# Patient Record
Sex: Female | Born: 1960
Health system: Southern US, Community
[De-identification: ages and names within clinical notes are randomized; demographics above are authoritative.]

## PROBLEM LIST (undated history)

## (undated) DIAGNOSIS — Z9889 Other specified postprocedural states: Secondary | ICD-10-CM

## (undated) DIAGNOSIS — J45909 Unspecified asthma, uncomplicated: Secondary | ICD-10-CM

## (undated) DIAGNOSIS — T8859XA Other complications of anesthesia, initial encounter: Secondary | ICD-10-CM

## (undated) DIAGNOSIS — D649 Anemia, unspecified: Secondary | ICD-10-CM

## (undated) DIAGNOSIS — C73 Malignant neoplasm of thyroid gland: Secondary | ICD-10-CM

## (undated) DIAGNOSIS — M1990 Unspecified osteoarthritis, unspecified site: Secondary | ICD-10-CM

## (undated) HISTORY — PX: TONSILLECTOMY: SUR1361

## (undated) HISTORY — PX: BUNIONECTOMY: SHX129

## (undated) HISTORY — PX: THYROIDECTOMY: SHX17

## (undated) HISTORY — PX: APPENDECTOMY: SHX54

---
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
## 1994-01-27 HISTORY — PX: BUNIONECTOMY: SHX129

## 2000-01-28 HISTORY — PX: THYROIDECTOMY: SHX17

## 2007-02-25 ENCOUNTER — Emergency Department (HOSPITAL_COMMUNITY): Admission: EM | Admit: 2007-02-25 | Discharge: 2007-02-25 | Payer: Self-pay | Admitting: Family Medicine

## 2008-02-18 ENCOUNTER — Ambulatory Visit: Payer: Self-pay | Admitting: Family Medicine

## 2008-02-18 DIAGNOSIS — J45909 Unspecified asthma, uncomplicated: Secondary | ICD-10-CM

## 2008-03-07 ENCOUNTER — Ambulatory Visit: Payer: Self-pay | Admitting: Occupational Medicine

## 2008-03-10 ENCOUNTER — Ambulatory Visit: Payer: Self-pay | Admitting: Family Medicine

## 2008-03-12 ENCOUNTER — Encounter (INDEPENDENT_AMBULATORY_CARE_PROVIDER_SITE_OTHER): Payer: Self-pay | Admitting: Occupational Medicine

## 2009-05-09 ENCOUNTER — Encounter (HOSPITAL_COMMUNITY): Admission: RE | Admit: 2009-05-09 | Discharge: 2009-08-07 | Payer: Self-pay | Admitting: Internal Medicine

## 2009-11-22 ENCOUNTER — Ambulatory Visit (HOSPITAL_BASED_OUTPATIENT_CLINIC_OR_DEPARTMENT_OTHER): Admission: RE | Admit: 2009-11-22 | Discharge: 2009-11-22 | Payer: Self-pay | Admitting: Internal Medicine

## 2009-11-22 ENCOUNTER — Ambulatory Visit: Payer: Self-pay | Admitting: Internal Medicine

## 2009-11-22 ENCOUNTER — Telehealth: Payer: Self-pay | Admitting: Internal Medicine

## 2009-11-22 ENCOUNTER — Ambulatory Visit: Payer: Self-pay | Admitting: Diagnostic Radiology

## 2009-11-22 DIAGNOSIS — M25579 Pain in unspecified ankle and joints of unspecified foot: Secondary | ICD-10-CM | POA: Insufficient documentation

## 2009-11-22 LAB — CONVERTED CEMR LAB
Ferritin: 2 ng/mL — ABNORMAL LOW (ref 10–291)
Iron: 18 ug/dL — ABNORMAL LOW (ref 42–145)
TIBC: 442 ug/dL (ref 250–470)
UIBC: 424 ug/dL

## 2009-11-23 ENCOUNTER — Telehealth: Payer: Self-pay | Admitting: Internal Medicine

## 2009-11-26 ENCOUNTER — Encounter: Payer: Self-pay | Admitting: Internal Medicine

## 2009-11-29 ENCOUNTER — Telehealth: Payer: Self-pay | Admitting: Internal Medicine

## 2009-12-05 ENCOUNTER — Other Ambulatory Visit: Admission: RE | Admit: 2009-12-05 | Discharge: 2009-12-05 | Payer: Self-pay | Admitting: Internal Medicine

## 2009-12-05 ENCOUNTER — Ambulatory Visit: Payer: Self-pay | Admitting: Family

## 2009-12-05 DIAGNOSIS — Z85850 Personal history of malignant neoplasm of thyroid: Secondary | ICD-10-CM

## 2009-12-07 ENCOUNTER — Encounter: Payer: Self-pay | Admitting: Family

## 2009-12-10 ENCOUNTER — Encounter: Payer: Self-pay | Admitting: Internal Medicine

## 2009-12-11 ENCOUNTER — Ambulatory Visit: Payer: Self-pay | Admitting: Internal Medicine

## 2009-12-11 LAB — CONVERTED CEMR LAB: Fecal Occult Bld: NEGATIVE

## 2010-01-03 ENCOUNTER — Encounter: Payer: Self-pay | Admitting: Internal Medicine

## 2010-01-03 LAB — CONVERTED CEMR LAB
Basophils Absolute: 0 10*3/uL (ref 0.0–0.1)
Eosinophils Relative: 4 % (ref 0–5)
HCT: 41.1 % (ref 36.0–46.0)
Hemoglobin: 13 g/dL (ref 12.0–15.0)
Lymphocytes Relative: 18 % (ref 12–46)
Lymphs Abs: 1.2 10*3/uL (ref 0.7–4.0)
Monocytes Absolute: 0.3 10*3/uL (ref 0.1–1.0)
Monocytes Relative: 5 % (ref 3–12)
RBC: 4.85 M/uL (ref 3.87–5.11)
RDW: 22.9 % — ABNORMAL HIGH (ref 11.5–15.5)

## 2010-01-18 ENCOUNTER — Ambulatory Visit: Payer: Self-pay | Admitting: Internal Medicine

## 2010-02-26 NOTE — Letter (Signed)
   [facility] [address] [place], [place]  [address]  [place] Phone: [phone]      December 07, 2009   [facility] [address] [place], [place] [address]  RE:  LAB RESULTS  Dear  Ms. [facility],  The following is an interpretation of your most recent lab tests.  Please take note of any instructions provided or changes to medications that have resulted from your lab work.  Pap Smear: normal - follow up in 1 year   Sincerely Yours,    Lemont Fillers FNP  Appended Document:  mailed

## 2010-02-26 NOTE — Progress Notes (Signed)
Summary: faxed Dr Allena Katz for Medical Records   Phone Note Outgoing Call   Call placed by: Marj Call placed to: Dr Smiley Houseman Summary of Call: faxed Dr Allena Katz for medical records  Initial call taken by: Roselle Locus,  November 22, 2009 9:13 AM

## 2010-02-26 NOTE — Progress Notes (Signed)
Summary: Lab results  Phone Note Outgoing Call   Summary of Call: call pt - iron levels low. I suggest pt take iron supplement (slow Fe ) once daily.  do not take with thyroid medication Initial call taken by: D. Thomos Lemons DO,  November 23, 2009 3:02 PM  Follow-up for Phone Call        RBC folate will need to be redrawn per Katrina at [place] [place]. Specimen quantity not sufficient to complete test.  Needs to call pt to redraw specimen and send new order to lab. Nicki Guadalajara Fergerson CMA Duncan Dull)  November 23, 2009 4:43 PM   Additional Follow-up for Phone Call Additional follow up Details #1::        [facility] for Pt to CB Additional Follow-up by: Payton Spark CMA,  November 23, 2009 3:32 PM    Additional Follow-up for Phone Call Additional follow up Details #2::    call placed to patient at [phone], she has been advised per Dr Artist Pais instructions.  She states she will be in today for blood draw. Follow-up by: [name] CMA,  November 26, 2009 8:20 AM

## 2010-02-26 NOTE — Assessment & Plan Note (Signed)
Summary: New est care/dt UMR--Rm 3   Vital Signs:  Patient profile:   50 year old female Menstrual status:  regular LMP:     11/22/2009 Height:      65.5 inches Weight:      202.50 pounds BMI:     33.31 Temp:     98.1 degrees F oral Pulse rate:   66 / minute Pulse rhythm:   regular Resp:     16 per minute BP sitting:   138 / 90  (left arm) Cuff size:   regular  Vitals Entered By: Mervin Kung CMA Duncan Dull) (November 22, 2009 9:41 AM) CC: New pt to establish care. Recently had low hemoglobin at her health assessment. Is Patient Diabetic? No Pain Assessment Patient in pain? no      LMP (date): 11/22/2009     Menstrual Status regular Enter LMP: 11/22/2009 Last PAP Result normal   Primary Care Provider:  Dondra Spry DO  CC:  New pt to establish care. Recently had low hemoglobin at her health assessment..  History of Present Illness:  50 y/o female to establish she denies chronic illness she recently found to have anemia as recent health screening she denies unusually heavy menstruations   Current Diet: Breakfast: carnation instand breakfast,  gram cracker Lunch:  sandwich,  broccholi ,  cheese DInner:  mcdonald or burger king,  [place] bacon Snacks:  chocolate, cookies Beverage:  water, diet dr. 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 Allena Katz) Hx of chicken pox GERD Allergies  Past Surgical History: Thyroidectomy--2005 Appendectomy--1968 Tonsillectomy / Addenoidectomy--1967 Bunionectomy w/pin Rt. 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   Head:  normocephalic and atraumatic.   Eyes:  pupils equal, pupils round, and pupils reactive to light.   Ears:  R ear normal and L ear normal.   Mouth:  pharynx pink and moist.   Neck:  No deformities, masses, or tenderness noted.no carotid bruits.   Lungs:  normal respiratory effort, normal breath sounds, no crackles, and no wheezes.   Heart:  normal rate, regular rhythm, no murmur, and no gallop.   Abdomen:  soft, non-tender, normal bowel sounds, no masses, no hepatomegaly, and no splenomegaly.   Extremities:  No lower extremity edema  Neurologic:  cranial nerves II-XII intact and gait normal.   Psych:  normally interactive, good eye contact, not anxious appearing, and not depressed appearing.     Impression & Recommendations:  Problem # 1:  ANEMIA (ICD-285.9) pt found to have mild anemia at recent health screen. 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 1 tab by mouth once daily monday through saturday and 1 1/2 tablets on sunday. 2)  Zantac  150 Mg Tabs (Ranitidine hcl) .... Take 1 tab by mouth at bedtime. 3)  Loratadine 10 Mg Tabs (Loratadine) .... Take 1 tablet daily as needed. 4)  Proair Hfa 108 (90 Base) Mcg/act Aers (Albuterol sulfate) .[name].. 1 - 2 puffs as needed. 5)  Sudafed Pe Maximum Strength 10 Mg Tabs (Phenylephrine hcl) .... As needed. 6)  Ibuprofen 200 Mg Tabs (Ibuprofen) .... As needed. 7)  Aleve 220 Mg Tabs (Naproxen sodium) .... As needed  Patient Instructions: 1)  Please schedule a follow-up appointment in 2 months. 2)  CBC:  285.9 3)  Please return for lab work one (1) week before your next appointment.  4)  Schedule separate appointment for PAP / Pelvic with Sandford Craze   Orders Added: 1)  T-Folic Acid; RBC [82747-23360] 2)  T-Iron [41660-63016] 3)  T-Iron Binding Capacity (TIBC) [[address]] 4)  T-Ferritin [82728-23350] 5)  T-Vitamin B12 [82607-23330] 6)  Mammogram (Screening) [Mammo] 7)  New Patient Level III [73220]     Preventive Care Screening  Last Flu Shot:    Date:  11/07/2009    Results:  historical   Bone Density:    Date:  01/28/2007    Results:  normal std dev  Last Tetanus Booster:    Date:  09/28/2002    Results:  Historical   Mammogram:    Date:  01/27/2002    Results:  normal   Pap Smear:    Date:  01/27/2002    Results:  normal      Pt  had abnormal pap smear in past and biopsy. Atypical cells. Recent pap smear normal. Mervin Kung CMA Duncan Dull)  November 22, 2009 9:56 AM    Current Allergies (reviewed today): No known allergies

## 2010-02-26 NOTE — Assessment & Plan Note (Signed)
Summary: pap/mhf-- Rm 5   Vital Signs:  Patient profile:   50 year old female Menstrual status:  regular LMP:     11/23/2009 Height:      65.5 inches Weight:      201.50 pounds BMI:     33.14 Temp:     97.9 degrees F oral Pulse rate:   72 / minute Pulse rhythm:   regular Resp:     16 per minute BP sitting:   116 / 76  (right arm) Cuff size:   large  Vitals Entered By: Mervin Kung CMA Duncan Dull) (December 05, 2009 8:56 AM) CC: Pt here for pap smear. Is Patient Diabetic? No Pain Assessment Patient in pain? no      Comments Pt states she has started Slo Fe 1 once daily. Nicki Guadalajara Fergerson CMA Duncan Dull)  December 05, 2009 9:01 AM  LMP (date): 11/23/2009     Enter LMP: 11/23/2009 Last PAP Result normal   Primary Care Elza Sortor:  Dondra Spry DO  CC:  Pt here for pap smear.[name]  History of Present Illness: Ms Wingard is a 50 year old female who presents today for a routine Pap smear.  She recently completed her mammogram in the end of October and it was normal.  She reports + history of abnormal pap smear in 1992 whichwas followed by a  laser cone biopsy.  Patient's last Pap smear was in 1994.  Patient is [id number].  LMP 10 days ago- has one day heavy 5 days, denies cramping.  She is being treated for iron deficiency anemia.  Allergies (verified): No Known Drug Allergies  Past History:  Past Medical History: Last updated: 11/22/2009 Asthma Thyroid Cancer--2005 (Stage IV - followed by Dr. Allena Katz) Hx of chicken pox GERD Allergies  Past Surgical History: Last updated: 11/22/2009 Thyroidectomy--2005 Appendectomy--1968 Tonsillectomy / Addenoidectomy--1967 Bunionectomy w/pin Rt. great toe--1998 Laser Cone Biopsy of cervix--1992  Review of Systems       see HPI  Physical Exam  General:  Well-developed,well-nourished,in no acute distress; alert,appropriate and cooperative throughout examination Head:  Normocephalic and atraumatic without obvious abnormalities. No  apparent alopecia or balding. Breasts:  No mass, nodules, thickening, tenderness, bulging, retraction, inflamation, nipple discharge or skin changes noted.   Lungs:  Normal respiratory effort, chest expands symmetrically. Lungs are clear to auscultation, no crackles or wheezes. Heart:  Normal rate and regular rhythm. S1 and S2 normal without gallop, murmur, click, rub or other extra sounds. Genitalia:  Pelvic Exam:        External: normal female genitalia without lesions or masses        Vagina: normal without lesions or masses        Cervix: normal without lesions or masses        Adnexa: normal bimanual exam without masses or fullness        Uterus: normal by palpation        Pap smear: performed   Impression & Recommendations:  Problem # 1:  ROUTINE GYNECOLOGICAL EXAMINATION (ICD-V72.31) Assessment Comment Only Pap was performed today.  Given history of abnormal pap, will plan f/u pap in 1 year.   Complete Medication List: 1)  Synthroid 137 Mcg Tabs (Levothyroxine sodium) .[name].. 1 tab by mouth once daily monday through saturday and 1 1/2 tablets on sunday. 2)  Zantac 150 Mg Tabs (Ranitidine hcl) .... Take 1 tab by mouth at bedtime. 3)  Loratadine 10 Mg Tabs (Loratadine) .... Take 1 tablet daily as needed. 4)  Proair  Hfa 108 (90 Base) Mcg/act Aers (Albuterol sulfate) .[name].. 1 - 2 puffs as needed. 5)  Sudafed Pe Maximum Strength 10 Mg Tabs (Phenylephrine hcl) .... As needed. 6)  Ibuprofen 200 Mg Tabs (Ibuprofen) .... As needed. 7)  Aleve 220 Mg Tabs (Naproxen sodium) .... As needed 8)  Slow Fe 160 (50 Fe) Mg Cr-tabs (Ferrous sulfate dried) .... Take 1 tablet by mouth once a day.  Patient Instructions: 1)  We will contact you with the results of your Pap smear.    Orders Added: 1)  Est. Patient Level III [47829]    Current Allergies (reviewed today): No known allergies

## 2010-02-26 NOTE — Letter (Signed)
Summary: [facility]: Immunoassay Fecal Occult Blood (iFOB) Order Psychologist, counselling at [facility]  [address]   [place], [place] [address]   Phone: [phone]  Fax: [phone]      [facility]: Immunoassay Fecal Occult Blood (iFOB) Order Form   November 22, 2009 MRN: [id number]   April Stone 01/30/1960   Physicican Name:Dr Thomos Lemons  Diagnosis Code: V70.0      [name] CMA

## 2010-02-26 NOTE — Progress Notes (Signed)
Summary: records rec from Dr Allena Katz   Phone Note Other Incoming   Caller: Dr Allena Katz  Summary of Call: records rec from Dr Allena Katz  Initial call taken by: Roselle Locus,  November 29, 2009 1:07 PM

## 2010-02-26 NOTE — Letter (Signed)
     December 11, 2009   [facility] [address] [place], [place] [address]  RE:  LAB RESULTS  Dear  Ms. [facility],  The following is an interpretation of your most recent lab tests.  Please take note of any instructions provided or changes to medications that have resulted from your lab work.  Hemoccult Test for blood in stool:  negative       Sincerely Yours,    Dr. Thomos Lemons  Appended Document:  mailed

## 2010-02-26 NOTE — Miscellaneous (Signed)
Summary: lab order  Clinical Lists Changes  Orders: Added new Test order of T-CBC w/Diff [phone]) - Signed

## 2010-02-26 NOTE — Letter (Signed)
Summary: [facility]: Immunoassay Fecal Occult Blood (iFOB) Order Psychologist, counselling at [facility]  [address]   [place], [place] [address]   Phone: [phone]  Fax: [phone]      [facility]: Immunoassay Fecal Occult Blood (iFOB) Order Form   December 10, 2009 MRN: [id number]   April Stone [birth year]   Physicican Name: Thomos Lemons  Diagnosis Code: V70.0      [name] CMA

## 2010-02-28 NOTE — Assessment & Plan Note (Signed)
Summary: 2 month follow up/mhf   Vital Signs:  Patient profile:   50 year old female Menstrual status:  regular Height:      65.5 inches Weight:      199.75 pounds BMI:     32.85 O2 Sat:      98 % on Room air Temp:     98.1 degrees F oral Pulse rate:   71 / minute Resp:     22 per minute BP sitting:   110 / 80  (right arm) Cuff size:   large  Vitals Entered By: [name] CMA (January 18, 2010 9:12 AM)  O2 Flow:  Room air CC: 2 Month Follow up , asthma Is Patient Diabetic? No Pain Assessment Patient in pain? no      Comments no concerns, rx refill on proair, feel much better   Primary Care Provider:  D. Thomos Lemons DO  CC:  2 Month Follow up  and asthma.  History of Present Illness:       This is a 50 year old female who presents with asthma.  The patient denies cough, shortness of breath, chest pain, mucous production, and nocturnal awakening.  Previous effective treatment includes short acting beta-agonist: and inhaled corticosteroids.  rarely uses proair  Preventive Screening-Counseling & Management  Alcohol-Tobacco     Smoking Status: never  Allergies (verified): No Known Drug Allergies  Past History:  Past Medical History: Asthma Thyroid Cancer--2005 (Stage IV - followed by Dr. Allena Katz) Hx of chicken pox  GERD Allergies  Past Surgical History: Thyroidectomy--2005 Appendectomy--1968 Tonsillectomy / Addenoidectomy--1967 Bunionectomy w/pin Rt. 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  Physical Exam  General:  alert, well-developed, and well-nourished.   Lungs:  Normal respiratory effort, chest expands  symmetrically. Lungs are clear to auscultation, no crackles or wheezes. Heart:  Normal rate and regular rhythm. S1 and S2 normal without gallop, murmur, click, rub or other extra sounds.   Impression & Recommendations:  Problem # 1:  ANEMIA (ICD-285.9) Assessment New Hg normalized with oral iron replacement still mentruating heavily 1 day per month FOBT was negative we discussed routine screening colonoscopy next yr continue oral iron until menopause  Her updated medication list for this problem includes:    Slow Fe 160 (50 Fe) Mg Cr-tabs (Ferrous sulfate dried) .[name]... Take 1 tablet by mouth once a day.  Problem # 2:  ASTHMA (ICD-493.90) Assessment: Unchanged denies chronic cough only uses proair on rare occ. also before exercise and before exposure to cold air UTD with flu vaccine refilled rescue inhaler  Her updated medication list for this problem includes:    Proair Hfa 108 (90 Base) Mcg/act Aers (Albuterol sulfate) .[name]... 1 - 2 puffs as needed.  Problem # 3:  THYROID CANCER, HX OF (ICD-V10.87) Assessment: Unchanged pt to see endo in March for routine surveillance  Complete Medication List: 1)  Synthroid 137 Mcg Tabs (Levothyroxine sodium) .[name].. 1 tab by mouth once daily monday through saturday and 1 1/2 tablets on sunday. 2)  Zantac 150 Mg Tabs (Ranitidine hcl) .... Take 1 tab by mouth at bedtime. 3)  Loratadine 10 Mg Tabs (Loratadine) .... Take 1 tablet daily as needed. 4)  Proair Hfa 108 (90 Base) Mcg/act Aers (Albuterol sulfate) .[name].. 1 - 2 puffs as needed. 5)  Sudafed Pe Maximum Strength 10 Mg Tabs (Phenylephrine hcl) .... As needed. 6)  Ibuprofen 200 Mg Tabs (Ibuprofen) .... As needed. 7)  Aleve 220 Mg Tabs (Naproxen sodium) .... As needed 8)  Slow Fe 160 (50 Fe) Mg Cr-tabs (Ferrous sulfate dried) .... Take 1 tablet by mouth once a day.  Patient Instructions: 1)  Please schedule a follow-up appointment in 1 year. Prescriptions: PROAIR HFA 108 (90 BASE) MCG/ACT AERS  (ALBUTEROL SULFATE) 1 - 2 puffs as needed.  #1 x 5   Entered and Authorized by:   D. Thomos Lemons DO   Signed by:   D. Thomos Lemons DO on 01/18/2010   Method used:   Electronically to        [facility] [address] (retail)       [address]       [facility]       [place], [place]  [address]       Ph: [phone]       Fax: [phone]   RxID:   [id number]    Orders Added: 1)  Est. Patient Level III [84132]    Current Allergies (reviewed today): No known allergies

## 2010-11-21 ENCOUNTER — Encounter: Payer: Self-pay | Admitting: Internal Medicine

## 2010-11-21 DIAGNOSIS — Z0289 Encounter for other administrative examinations: Secondary | ICD-10-CM

## 2011-12-21 ENCOUNTER — Emergency Department (INDEPENDENT_AMBULATORY_CARE_PROVIDER_SITE_OTHER): Payer: 59

## 2011-12-21 ENCOUNTER — Emergency Department
Admission: EM | Admit: 2011-12-21 | Discharge: 2011-12-21 | Disposition: A | Discharge status: Home / Self Care | Payer: 59 | Source: Home / Self Care

## 2011-12-21 DIAGNOSIS — J40 Bronchitis, not specified as acute or chronic: Secondary | ICD-10-CM

## 2011-12-21 DIAGNOSIS — J069 Acute upper respiratory infection, unspecified: Secondary | ICD-10-CM

## 2011-12-21 DIAGNOSIS — R05 Cough: Secondary | ICD-10-CM

## 2011-12-21 DIAGNOSIS — J45901 Unspecified asthma with (acute) exacerbation: Secondary | ICD-10-CM

## 2011-12-21 DIAGNOSIS — D649 Anemia, unspecified: Secondary | ICD-10-CM | POA: Insufficient documentation

## 2011-12-21 DIAGNOSIS — R062 Wheezing: Secondary | ICD-10-CM

## 2011-12-21 HISTORY — DX: Anemia, unspecified: D64.9

## 2011-12-21 HISTORY — DX: Malignant neoplasm of thyroid gland: C73

## 2011-12-21 MED ORDER — ALBUTEROL SULFATE HFA 108 (90 BASE) MCG/ACT IN AERS: 2.0000 | INHALATION_SPRAY | RESPIRATORY_TRACT | Status: DC | PRN

## 2011-12-21 MED ORDER — HYDROCOD POLST-CHLORPHEN POLST 10-8 MG/5ML PO LQCR: 5.0000 mL | Freq: Two times a day (BID) | ORAL | Status: DC | PRN

## 2011-12-21 MED ORDER — METHYLPREDNISOLONE SODIUM SUCC 125 MG IJ SOLR
125.0000 mg | Freq: Once | INTRAMUSCULAR | Status: AC
  Administered 2011-12-21: 125 mg via INTRAMUSCULAR

## 2011-12-21 MED ORDER — PREDNISONE 50 MG PO TABS
ORAL_TABLET | ORAL | Status: DC
Start: 2011-12-21 — End: 2012-11-16

## 2011-12-21 MED ORDER — AZITHROMYCIN 250 MG PO TABS: ORAL_TABLET | ORAL | Status: DC

## 2011-12-21 NOTE — ED Notes (Signed)
April Stone complains of productive cough with yellow sputum, congestion, wheezing and sneezing for 3 weeks. She has taken mucinex with little relief. Denies fever, chills or sweats.

## 2011-12-21 NOTE — ED Provider Notes (Signed)
History     CSN: [id number]  Arrival date & time 12/21/11  1131   First MD Initiated Contact with Patient 12/21/11 1139      Chief Complaint  Patient presents with  . Cough    x 3 weeks  . Nasal Congestion    HPI  URI Symptoms Onset: 3 weeks  Description: nasal congestion, drainage, cough, wheezing, SOB.  Modifying factors:  Baseline hx/o asthma. Has been using inhaler up to 8x per day.   Symptoms Nasal discharge: yes Fever: no Sore throat: no Cough: yes Wheezing: yes Ear pain: no GI symptoms: no Sick contacts: yes   Red Flags  Stiff neck: no Dyspnea: mild Rash: no Swallowing difficulty: no  Sinusitis Risk Factors Headache/face pain: no Double sickening: no tooth pain: no  Allergy Risk Factors Sneezing: no Itchy scratchy throat: no Seasonal symptoms: no  Flu Risk Factors Headache: no muscle aches: no severe fatigue: no   Past Medical History  Diagnosis Date  . Thyroid disease   . Thyroid cancer   . Anemia     Past Surgical History  Procedure Date  . Appendectomy   . Thyroidectomy   . Bunionectomy     Family History  Problem Relation Age of Onset  . Hypertension Mother   . Hypertension Father     History  Substance Use Topics  . Smoking status: Never Smoker   . Smokeless tobacco: Never Used  . Alcohol Use: No    OB History    Grav Para Term Preterm Abortions TAB SAB Ect Mult Living                  Review of Systems  All other systems reviewed and are negative.    Allergies  Review of patient's allergies indicates no known allergies.  Home Medications   Current Outpatient Rx  Name  Route  Sig  Dispense  Refill  . ALBUTEROL SULFATE HFA 108 (90 BASE) MCG/ACT IN AERS   Inhalation   Inhale 2 puffs into the lungs every 6 (six) hours as needed.         [name] FERROUS SULFATE CR 160 (50 FE) MG PO TBCR   Oral   Take 160 mg by mouth daily.         [name] LEVOTHYROXINE SODIUM 137 MCG PO TABS   Oral   Take 137 mcg by mouth  daily.         [name] RANITIDINE HCL 150 MG PO TABS   Oral   Take 150 mg by mouth 2 (two) times daily.           BP 147/88  Pulse 76  Temp 98.1 F (36.7 C) (Oral)  Resp 18  Ht 5\' 6"  (1.676 m)  Wt 193 lb (87.544 kg)  BMI 31.15 kg/m2  SpO2 98%  Physical Exam  Constitutional: She appears well-developed and well-nourished.  HENT:  Head: Normocephalic and atraumatic.  Right Ear: External ear normal.  Left Ear: External ear normal.       +nasal erythema, rhinorrhea bilaterally, + post oropharyngeal erythema    Eyes: Conjunctivae normal are normal. Pupils are equal, round, and reactive to light.  Neck: Normal range of motion. Neck supple.  Cardiovascular: Normal rate, regular rhythm and normal heart sounds.   Pulmonary/Chest: Effort normal. She has wheezes.       + faint rales in LLL  Abdominal: Soft.  Musculoskeletal: Normal range of motion.  Lymphadenopathy:    She has no cervical adenopathy.  Neurological: She is alert.  Skin: Skin is warm.    ED Course  Procedures (including critical care time)  Labs Reviewed - No data to display No results found.   1. URI (upper respiratory infection)   2. Bronchitis   3. Asthma exacerbation       MDM  Viral induced asthma exacerbation.  CXR negative for infiltrate.  Solumedrol 125mg  IM x1 Prednisone x 7 days.  Zpak for atypical coverage.  Albuterol refilled.  Discussed infectious and resp red flags.  Follow up with PCP in 7-10 days if sxs not improved.     The patient and/or caregiver has been counseled thoroughly with regard to treatment plan and/or medications prescribed including dosage, schedule, interactions, rationale for use, and possible side effects and they verbalize understanding. Diagnoses and expected course of recovery discussed and will return if not improved as expected or if the condition worsens. Patient and/or caregiver verbalized understanding.             Doree Albee, MD 12/21/11  [phone]

## 2012-07-26 ENCOUNTER — Encounter (HOSPITAL_BASED_OUTPATIENT_CLINIC_OR_DEPARTMENT_OTHER): Payer: Self-pay | Admitting: *Deleted

## 2012-07-26 ENCOUNTER — Emergency Department (HOSPITAL_BASED_OUTPATIENT_CLINIC_OR_DEPARTMENT_OTHER)
Admission: EM | Admit: 2012-07-26 | Discharge: 2012-07-27 | Disposition: A | Discharge status: Home / Self Care | Payer: 59 | Attending: Emergency Medicine | Admitting: Emergency Medicine

## 2012-07-26 DIAGNOSIS — Z7982 Long term (current) use of aspirin: Secondary | ICD-10-CM | POA: Insufficient documentation

## 2012-07-26 DIAGNOSIS — Z862 Personal history of diseases of the blood and blood-forming organs and certain disorders involving the immune mechanism: Secondary | ICD-10-CM | POA: Insufficient documentation

## 2012-07-26 DIAGNOSIS — Z85850 Personal history of malignant neoplasm of thyroid: Secondary | ICD-10-CM | POA: Insufficient documentation

## 2012-07-26 DIAGNOSIS — IMO0002 Reserved for concepts with insufficient information to code with codable children: Secondary | ICD-10-CM | POA: Insufficient documentation

## 2012-07-26 DIAGNOSIS — Y9389 Activity, other specified: Secondary | ICD-10-CM | POA: Insufficient documentation

## 2012-07-26 DIAGNOSIS — M2391 Unspecified internal derangement of right knee: Secondary | ICD-10-CM

## 2012-07-26 DIAGNOSIS — E079 Disorder of thyroid, unspecified: Secondary | ICD-10-CM | POA: Insufficient documentation

## 2012-07-26 DIAGNOSIS — X500XXA Overexertion from strenuous movement or load, initial encounter: Secondary | ICD-10-CM | POA: Insufficient documentation

## 2012-07-26 DIAGNOSIS — Y929 Unspecified place or not applicable: Secondary | ICD-10-CM | POA: Insufficient documentation

## 2012-07-26 DIAGNOSIS — S8990XA Unspecified injury of unspecified lower leg, initial encounter: Secondary | ICD-10-CM | POA: Insufficient documentation

## 2012-07-26 NOTE — ED Provider Notes (Signed)
History  This chart was scribed for April Booze, MD by Ardelia Mems, ED Scribe. This patient was seen in room MH05/MH05 and the patient's care was started at 11:54 PM.  CSN: [id number]  Arrival date & time 07/26/12  2343    Chief Complaint  Patient presents with  . Knee Pain    The history is provided by the patient. No language interpreter was used.   HPI Comments: April Stone is a 52 y.o. female with a hx of anemia and thyroid cancer who presents to the Emergency Department complaining of a sudden onset of constant, waxing an waning, mild-to-severe right knee pain onset 1 hour ago. Pt states that when the pain onset, she was getting out of bed, and felt her right knee twist and she states that she heard a "pop" in her knee. Pt has her right leg, partially retracted, with the knee flexed and states that her pain worsens with straightening her leg. She ranks her pain as a "2/10" while still and with the leg retracted, and as an "8/10" with movement and leg straightening. Pt denies any hx of knee pain or prior injury. Pt states that she has tried nothing for pain. Pt states that her thyroid cancer has not caused her any issues in 8 years, and she states that she is otherwise healthy. Pt denies weakness or numbness of the right leg, left knee pain, back pain, neck pain, abdominal pain, chest pain or any other symptoms. Pt denies smoking and denies alcohol use.  Endocrinologist- Dr. Allena Katz PCP- none   Past Medical History  Diagnosis Date  . Thyroid disease   . Thyroid cancer   . Anemia    Past Surgical History  Procedure Laterality Date  . Appendectomy    . Thyroidectomy    . Bunionectomy     Family History  Problem Relation Age of Onset  . Hypertension Mother   . Hypertension Father    History  Substance Use Topics  . Smoking status: Never Smoker   . Smokeless tobacco: Never Used  . Alcohol Use: No   OB History   Grav Para Term Preterm Abortions TAB SAB Ect Mult Living                  Review of Systems  Constitutional: Negative for chills.  HENT: Negative for congestion, sore throat and rhinorrhea.   Eyes: Negative for visual disturbance.  Respiratory: Negative for cough and shortness of breath.   Cardiovascular: Negative for chest pain.  Gastrointestinal: Negative for nausea, vomiting, abdominal pain and diarrhea.  Genitourinary: Negative for dysuria.  Musculoskeletal:       Right knee pain.  Skin: Negative for rash.  Neurological: Negative for weakness and numbness.  Psychiatric/Behavioral: Negative for confusion.  A complete 10 system review of systems was obtained and all systems are negative except as noted in the HPI and PMH.    Allergies  Review of patient's allergies indicates no known allergies.  Home Medications   Current Outpatient Rx  Name  Route  Sig  Dispense  Refill  . albuterol (PROVENTIL HFA;VENTOLIN HFA) 108 (90 BASE) MCG/ACT inhaler   Inhalation   Inhale 2 puffs into the lungs every 6 (six) hours as needed.         [name] albuterol (PROVENTIL HFA;VENTOLIN HFA) 108 (90 BASE) MCG/ACT inhaler   Inhalation   Inhale 2 puffs into the lungs every 4 (four) hours as needed for wheezing (cough, shortness of breath or wheezing.).  1 Inhaler   1   . azithromycin (ZITHROMAX) 250 MG tablet      Take 2 tabs PO x 1 dose, then 1 tab PO QD x 4 days   6 tablet   0   . chlorpheniramine-HYDROcodone (TUSSIONEX PENNKINETIC ER) 10-8 MG/5ML LQCR   Oral   Take 5 mLs by mouth every 12 (twelve) hours as needed (cough).   60 mL   0   . ferrous sulfate dried (SLOW FE) 160 (50 FE) MG TBCR   Oral   Take 160 mg by mouth daily.         [name] levothyroxine (SYNTHROID, LEVOTHROID) 137 MCG tablet   Oral   Take 137 mcg by mouth daily.         . predniSONE (DELTASONE) 50 MG tablet      1 tab daily x 7 days   7 tablet   0   . ranitidine (ZANTAC) 150 MG tablet   Oral   Take 150 mg by mouth 2 (two) times daily.          Triage Vitals:  BP 142/78  Pulse 99  Temp(Src) 98.9 F (37.2 C) (Oral)  Resp 16  Ht 5\' 6"  (1.676 m)  Wt 199 lb (90.266 kg)  BMI 32.13 kg/m2  SpO2 100%  LMP 10/22/2011  Physical Exam  Nursing note and vitals reviewed. Constitutional: She is oriented to person, place, and time. She appears well-developed and well-nourished.  HENT:  Head: Normocephalic and atraumatic.  Eyes: EOM are normal. Pupils are equal, round, and reactive to light.  Neck: Normal range of motion. Neck supple. No tracheal deviation present.  Cardiovascular: Normal rate and regular rhythm.   Pulmonary/Chest: Effort normal. No respiratory distress.  Abdominal: Soft. There is no tenderness.  Musculoskeletal:  Right knee is flexed between 60 and 90 degrees, unable to extend past 60 degrees because of pain.   Neurological: She is alert and oriented to person, place, and time.  Skin: Skin is warm and dry. No rash noted.  Psychiatric: She has a normal mood and affect. Her behavior is normal.    ED Course  Procedures (including critical care time) Procedure: Knee manipulation Indication: Locked knee Informed consent was obtained including risks and benefits of procedure and alternatives. The patient was identified by 2 unit could identify her son-in verbally and by her hospital in bed with MRN. A timeout was performed. The skin was prepped with chlorhexidine and pain in knee joint was injected with 20 mL of lidocaine. Right knee was manipulated with improvement in range of motion but and she was unable to fully straighten the knee. She was allowed to rest for 15 minutes and then the knee spontaneously he was able to be fully extended. She is placed in knee immobilizer. Patient tolerated procedure well.  DIAGNOSTIC STUDIES: Oxygen Saturation is 100% on RA, normal by my interpretation.    COORDINATION OF CARE: 11:59 PM- Pt advised of option to either receive an injection in her knee or be discharged without an injection, and pt elects  to receive an injection.  12:08 AM- Lidocaine injection given to right knee. Right knee and leg ROM fully explored provide relief. Pt reports mild relief after this therapy. Pt advised to follow-up with an orthopedic doctor and pt agrees. Pt offered Vicodin for pain and pt declines.    Medications  lidocaine (XYLOCAINE) 2 % (with pres) injection (not administered)     1. Locked knee, right     MDM  Locked right knee. This is probably from torn meniscus. Patient was offered options of referral to orthopedics versus attempt to manipulate the knee in the ED. She has requested manipulation the ED. Please see procedure note above. She is referred to orthopedics for followup.    I personally performed the services described in this documentation, which was scribed in my presence. The recorded information has been reviewed and is accurate.   April Booze, MD 07/27/12 [place] [place]

## 2012-07-26 NOTE — ED Notes (Signed)
Pt c/o right knee pain x 1 hr

## 2012-07-27 MED ORDER — LIDOCAINE HCL 2 % IJ SOLN
INTRAMUSCULAR | Status: AC
  Filled 2012-07-27: qty 20

## 2012-07-27 NOTE — ED Notes (Signed)
Pt reports her knee is now mobile and functioning WNL denies pain or discomfort Dr Preston Fleeting at bedside.

## 2012-11-16 ENCOUNTER — Emergency Department
Admission: EM | Admit: 2012-11-16 | Discharge: 2012-11-16 | Disposition: A | Discharge status: Home / Self Care | Payer: 59 | Source: Home / Self Care | Attending: Family Medicine | Admitting: Family Medicine

## 2012-11-16 ENCOUNTER — Encounter: Payer: Self-pay | Admitting: Emergency Medicine

## 2012-11-16 DIAGNOSIS — J45909 Unspecified asthma, uncomplicated: Secondary | ICD-10-CM

## 2012-11-16 MED ORDER — AZITHROMYCIN 250 MG PO TABS: ORAL_TABLET | ORAL | Status: DC

## 2012-11-16 MED ORDER — FLUTICASONE PROPIONATE HFA 44 MCG/ACT IN AERO: 2.0000 | INHALATION_SPRAY | Freq: Two times a day (BID) | RESPIRATORY_TRACT | Status: DC

## 2012-11-16 MED ORDER — PREDNISONE 20 MG PO TABS: 20.0000 mg | ORAL_TABLET | Freq: Two times a day (BID) | ORAL | Status: DC

## 2012-11-16 MED ORDER — ALBUTEROL SULFATE HFA 108 (90 BASE) MCG/ACT IN AERS: 2.0000 | INHALATION_SPRAY | RESPIRATORY_TRACT | Status: DC | PRN

## 2012-11-16 NOTE — ED Notes (Signed)
Pt c/o cough and wheezing x 3wks. Denies fever.

## 2012-11-16 NOTE — ED Provider Notes (Signed)
CSN: [id number]     Arrival date & time 11/16/12  1001 History   First MD Initiated Contact with Patient 11/16/12 1030     Chief Complaint  Patient presents with  . Cough  . Wheezing      HPI Comments: Patient has a history of mild seasonal asthma and seasonal allergic rhinitis, usually worse in the fall and spring.  About 3 weeks ago she developed recurrent sinus congestion without sore throat or fever.  Over the past week she has developed increasing non-productive cough and wheezing, especially at night.  No fevers, chills, and sweats.  She has used an albuterol inhaler in the past on a PRN basis, but presently does not have one.  She has never been on a controller med inhaler.  The history is provided by the patient.    Past Medical History  Diagnosis Date  . Thyroid disease   . Thyroid cancer   . Anemia    Past Surgical History  Procedure Laterality Date  . Appendectomy    . Thyroidectomy    . Bunionectomy     Family History  Problem Relation Age of Onset  . Hypertension Mother   . Hypertension Father    History  Substance Use Topics  . Smoking status: Never Smoker   . Smokeless tobacco: Never Used  . Alcohol Use: No   OB History   Grav Para Term Preterm Abortions TAB SAB Ect Mult Living                 Review of Systems No sore throat + cough No pleuritic pain + wheezing + nasal congestion No post-nasal drainage No sinus pain/pressure No itchy/red eyes No earache No hemoptysis + SOB No fever/chills No nausea No vomiting No abdominal pain No diarrhea No urinary symptoms No skin rashes No fatigue No myalgias No headache Used OTC meds without relief   Allergies  Review of patient's allergies indicates no known allergies.  Home Medications   Current Outpatient Rx  Name  Route  Sig  Dispense  Refill  . albuterol (PROVENTIL HFA;VENTOLIN HFA) 108 (90 BASE) MCG/ACT inhaler   Inhalation   Inhale 2 puffs into the lungs every 4 (four) hours as  needed for wheezing (cough, shortness of breath or wheezing.).   1 Inhaler   1   . azithromycin (ZITHROMAX Z-PAK) 250 MG tablet      Take 2 tabs today; then begin one tab once daily for 4 more days. (Rx void after 11/24/12)   6 each   0   . ferrous sulfate dried (SLOW FE) 160 (50 FE) MG TBCR   Oral   Take 160 mg by mouth daily.         . fluticasone (FLOVENT HFA) 44 MCG/ACT inhaler   Inhalation   Inhale 2 puffs into the lungs 2 (two) times daily.   1 Inhaler   1   . levothyroxine (SYNTHROID, LEVOTHROID) 137 MCG tablet   Oral   Take 137 mcg by mouth daily.         . predniSONE (DELTASONE) 20 MG tablet   Oral   Take 1 tablet (20 mg total) by mouth 2 (two) times daily.   10 tablet   0   . ranitidine (ZANTAC) 150 MG tablet   Oral   Take 150 mg by mouth 2 (two) times daily.          BP 134/76  Pulse 88  Temp(Src) 97.7 F (36.5 C) (Oral)  Resp 18  Wt 199 lb (90.266 kg)  BMI 32.13 kg/m2  SpO2 98%  LMP 10/22/2011 Physical Exam Nursing notes and Vital Signs reviewed. Appearance:  Patient appears stated age, and in no acute distress.  Patient is obese (BMI 32.1) Eyes:  Pupils are equal, round, and reactive to light and accomodation.  Extraocular movement is intact.  Conjunctivae are not inflamed  Ears:  Canals normal.  Tympanic membranes normal.  Nose:  Mildly congested turbinates.  No sinus tenderness.   Pharynx:  Normal Neck:  Supple.   No adenopathy Lungs:  Faint bilateral wheezes posteriorly.  Breath sounds are equal.  Heart:  Regular rate and rhythm without murmurs, rubs, or gallops.  Abdomen:  Nontender without masses or hepatosplenomegaly.  Bowel sounds are present.  No CVA or flank tenderness.  Extremities:  No edema.  No calf tenderness Skin:  No rash present.   ED Course  Procedures  none       MDM   1. Asthmatic bronchitis    There is no evidence of bacterial infection today.   Begin prednisone burst.  Begin controller med:  Flovent. Refill  rescue albuterol inhaler  Take Mucinex D (guaifenesin with decongestant) twice daily for congestion.  Increase fluid intake, rest. May use Afrin nasal spray (or generic oxymetazoline) twice daily for about 5 days.  Also recommend using saline nasal spray several times daily and saline nasal irrigation (AYR is a common brand) Stop all antihistamines for now, and other non-prescription cough/cold preparations. May take Delsym Cough Suppressant at bedtime for nighttime cough.  Begin Azithromycin if not improving about one week or if persistent fever develops (Given a prescription to hold, with an expiration date)  Follow-up with family doctor to discuss an Asthma Plan     Lattie Haw, MD 11/16/12 1207

## 2013-01-21 ENCOUNTER — Encounter (HOSPITAL_BASED_OUTPATIENT_CLINIC_OR_DEPARTMENT_OTHER): Payer: Self-pay | Admitting: Emergency Medicine

## 2013-01-21 ENCOUNTER — Emergency Department (HOSPITAL_BASED_OUTPATIENT_CLINIC_OR_DEPARTMENT_OTHER): Payer: 59

## 2013-01-21 ENCOUNTER — Emergency Department (HOSPITAL_BASED_OUTPATIENT_CLINIC_OR_DEPARTMENT_OTHER)
Admission: EM | Admit: 2013-01-21 | Discharge: 2013-01-21 | Disposition: A | Discharge status: Home / Self Care | Payer: 59 | Attending: Emergency Medicine | Admitting: Emergency Medicine

## 2013-01-21 DIAGNOSIS — Z79899 Other long term (current) drug therapy: Secondary | ICD-10-CM | POA: Insufficient documentation

## 2013-01-21 DIAGNOSIS — D649 Anemia, unspecified: Secondary | ICD-10-CM | POA: Insufficient documentation

## 2013-01-21 DIAGNOSIS — J40 Bronchitis, not specified as acute or chronic: Secondary | ICD-10-CM | POA: Insufficient documentation

## 2013-01-21 DIAGNOSIS — IMO0001 Reserved for inherently not codable concepts without codable children: Secondary | ICD-10-CM | POA: Insufficient documentation

## 2013-01-21 DIAGNOSIS — J3489 Other specified disorders of nose and nasal sinuses: Secondary | ICD-10-CM | POA: Insufficient documentation

## 2013-01-21 DIAGNOSIS — R509 Fever, unspecified: Secondary | ICD-10-CM | POA: Insufficient documentation

## 2013-01-21 DIAGNOSIS — J209 Acute bronchitis, unspecified: Secondary | ICD-10-CM

## 2013-01-21 MED ORDER — ALBUTEROL SULFATE (5 MG/ML) 0.5% IN NEBU
5.0000 mg | INHALATION_SOLUTION | Freq: Once | RESPIRATORY_TRACT | Status: AC
  Administered 2013-01-21: 5 mg via RESPIRATORY_TRACT
  Filled 2013-01-21: qty 1

## 2013-01-21 MED ORDER — PREDNISONE 10 MG PO TABS
60.0000 mg | ORAL_TABLET | Freq: Once | ORAL | Status: AC
  Administered 2013-01-21: 60 mg via ORAL
  Filled 2013-01-21 (×2): qty 1

## 2013-01-21 MED ORDER — HYDROCODONE-HOMATROPINE 5-1.5 MG/5ML PO SYRP: 5.0000 mL | ORAL_SOLUTION | ORAL | Status: DC | PRN

## 2013-01-21 MED ORDER — PREDNISONE 50 MG PO TABS: 50.0000 mg | ORAL_TABLET | Freq: Every day | ORAL | Status: DC

## 2013-01-21 NOTE — ED Notes (Signed)
Returned from xray

## 2013-01-21 NOTE — ED Provider Notes (Signed)
CSN: [id number]     Arrival date & time 01/21/13  0034 History   First MD Initiated Contact with Patient 01/21/13 0047     Chief Complaint  Patient presents with  . URI   (Consider location/radiation/quality/duration/timing/severity/associated sxs/prior Treatment) HPI Patient presents with 5 days of diffuse body aches, cough productive of yellow sputum, nasal congestion and subjective fevers and chills. She's had no chest pain ear pain, neck stiffness. 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 She has had some mild shortness of breath especially at nights. Denies any lower extremity swelling or pain., Past Medical History  Diagnosis Date  . Thyroid disease   . Thyroid cancer   . Anemia    Past Surgical History  Procedure Laterality Date  . Appendectomy    . Thyroidectomy    . Bunionectomy    . Tonsillectomy     Family History  Problem Relation Age of Onset  . Hypertension Mother   . Hypertension Father    History  Substance Use Topics  . Smoking status: Never Smoker   . Smokeless tobacco: Never Used  . Alcohol Use: No   OB History   Grav Para Term Preterm Abortions TAB SAB Ect Mult Living                 Review of Systems  Constitutional: Positive for fever, chills and fatigue.  HENT: Positive for congestion and rhinorrhea. Negative for ear pain.   Respiratory: Positive for cough and shortness of breath.   Cardiovascular: Negative for chest pain, palpitations and leg swelling.  Gastrointestinal: Negative for nausea, vomiting and abdominal pain.  Musculoskeletal: Positive for myalgias. Negative for neck pain and neck stiffness.  Skin: Negative for rash.  Neurological: Negative for dizziness, weakness, numbness and headaches.  All other systems reviewed and are negative.    Allergies  Review of patient's allergies indicates no known allergies.  Home Medications   Current Outpatient Rx  Name  Route  Sig  Dispense  Refill  . albuterol  (PROVENTIL HFA;VENTOLIN HFA) 108 (90 BASE) MCG/ACT inhaler   Inhalation   Inhale 2 puffs into the lungs every 4 (four) hours as needed for wheezing (cough, shortness of breath or wheezing.).   1 Inhaler   1   . azithromycin (ZITHROMAX Z-PAK) 250 MG tablet      Take 2 tabs today; then begin one tab once daily for 4 more days. (Rx void after 11/24/12)   6 each   0   . ferrous sulfate dried (SLOW FE) 160 (50 FE) MG TBCR   Oral   Take 160 mg by mouth daily.         . fluticasone (FLOVENT HFA) 44 MCG/ACT inhaler   Inhalation   Inhale 2 puffs into the lungs 2 (two) times daily.   1 Inhaler   1   . HYDROcodone-homatropine (HYCODAN) 5-1.5 MG/5ML syrup   Oral   Take 5 mLs by mouth every 4 (four) hours as needed for cough.   120 mL   0   . levothyroxine (SYNTHROID, LEVOTHROID) 137 MCG tablet   Oral   Take 137 mcg by mouth daily.         . predniSONE (DELTASONE) 20 MG tablet   Oral   Take 1 tablet (20 mg total) by mouth 2 (two) times daily.   10 tablet   0   . predniSONE (DELTASONE) 50 MG tablet   Oral   Take 1 tablet (50 mg total) by  mouth daily.   5 tablet   0   . ranitidine (ZANTAC) 150 MG tablet   Oral   Take 150 mg by mouth 2 (two) times daily.          BP 155/81  Pulse 90  Temp(Src) 98.6 F (37 C) (Oral)  Resp 20  Ht 5' 5.5" (1.664 m)  Wt 200 lb (90.719 kg)  BMI 32.76 kg/m2  SpO2 100%  LMP 10/22/2011 Physical Exam  Nursing note and vitals reviewed. Constitutional: She is oriented to person, place, and time. She appears well-developed and well-nourished. No distress.  HENT:  Head: Normocephalic and atraumatic.  Mouth/Throat: Oropharynx is clear and moist.  Bilateral nasal mucosa edema. No focal sinus tenderness to percussion.  Eyes: EOM are normal. Pupils are equal, round, and reactive to light.  Neck: Normal range of motion. Neck supple.  No neck stiffness or meningismus  Cardiovascular: Normal rate and regular rhythm.   Pulmonary/Chest: Effort  normal. No respiratory distress. She has no wheezes. She has rales.  Scattered few coarse breath sounds. No respiratory distress.  Abdominal: Soft. Bowel sounds are normal. She exhibits no distension and no mass. There is no tenderness. There is no rebound and no guarding.  Musculoskeletal: Normal range of motion. She exhibits no edema and no tenderness.  Neurological: She is alert and oriented to person, place, and time.  Patient is alert and oriented x3 with clear, goal oriented speech. Patient has 5/5 motor in all extremities. Sensation is intact to light touch. Patient has a normal gait and walks without assistance.   Skin: Skin is warm and dry. No rash noted. No erythema.  Psychiatric: She has a normal mood and affect. Her behavior is normal.    ED Course  Procedures (including critical care time) Labs Review Labs Reviewed - No data to display Imaging Review Dg Chest 2 View  01/21/2013   CLINICAL DATA:  Cough, congestion and shortness of breath.  EXAM: CHEST  2 VIEW  COMPARISON:  Chest radiograph performed 12/21/2011  FINDINGS: The lungs are well-aerated and clear. There is no evidence of focal opacification, pleural effusion or pneumothorax.  The heart is normal in size; the mediastinal contour is within normal limits. No acute osseous abnormalities are seen.  IMPRESSION: No acute cardiopulmonary process seen.   Electronically Signed   By: Roanna Raider M.D.   On: 01/21/2013 01:42    EKG Interpretation   None       MDM   1. Bronchitis with bronchospasm    Lungs clear. Patient's vital signs remained stable emergency department. Suspect bronchitis with possible bronchospasm. Get short course storage steroids and antitussives.    Loren Racer, MD 01/21/13 0230

## 2013-01-21 NOTE — ED Notes (Signed)
Pt sts fever, cough, body aches, shob, congestion; Denies N/V/D. +Chest congestion.

## 2013-05-23 ENCOUNTER — Emergency Department (INDEPENDENT_AMBULATORY_CARE_PROVIDER_SITE_OTHER): Payer: 59

## 2013-05-23 ENCOUNTER — Encounter: Payer: Self-pay | Admitting: Emergency Medicine

## 2013-05-23 ENCOUNTER — Emergency Department
Admission: EM | Admit: 2013-05-23 | Discharge: 2013-05-23 | Disposition: A | Discharge status: Home / Self Care | Payer: 59 | Source: Home / Self Care | Attending: Family Medicine | Admitting: Family Medicine

## 2013-05-23 DIAGNOSIS — IMO0002 Reserved for concepts with insufficient information to code with codable children: Secondary | ICD-10-CM

## 2013-05-23 DIAGNOSIS — S8392XA Sprain of unspecified site of left knee, initial encounter: Secondary | ICD-10-CM

## 2013-05-23 DIAGNOSIS — M25569 Pain in unspecified knee: Secondary | ICD-10-CM

## 2013-05-23 NOTE — Discharge Instructions (Signed)
Apply ice pack for 30 minutes every 1 to 3 hours today and tomorrow.  Elevate when possible.  Use crutches for 3 to 5 days.  Wear knee brace.      Begin leg raise exercises as per instruction sheet.  Continue Ibuprofen 200mg , 4 tabs every 8 hours with food.

## 2013-05-23 NOTE — ED Notes (Signed)
Pt c/o LT knee pain x 2 hrs ago. She reports that she was stepping up into a van when she felt a "pop". She took Motrin 800mg  and applied ice 1 hr ago.

## 2013-05-23 NOTE — ED Provider Notes (Signed)
CSN: [id number]     Arrival date & time 05/23/13  1610 History   First MD Initiated Contact with Patient 05/23/13 1626     Chief Complaint  Patient presents with  . Knee Pain      HPI Comments: Patient was climbing into a van two hours ago, with weight on her left knee, when she felt a sudden popping sensation in her left lateral knee followed by persistent pain with weight bearing.  She notes that in certain positions she will feel a brief locking sensation in her left knee with inability to bear weight.  She can massage the lateral aspect of her knee and again be able to bear weight.  Patient is a 53 y.o. female presenting with knee pain. The history is provided by the patient.  Knee Pain Location:  Knee Time since incident:  2 hours Injury: yes   Mechanism of injury comment:  Climbing into a van Knee location:  L knee Pain details:    Quality:  Aching   Radiates to:  Does not radiate   Severity:  Moderate   Onset quality:  Sudden   Timing:  Intermittent   Progression:  Unchanged Chronicity:  New Dislocation: no   Prior injury to area:  No Relieved by:  Nothing Worsened by:  Bearing weight Ineffective treatments:  None tried Associated symptoms: decreased ROM, stiffness and swelling   Associated symptoms: no back pain, no muscle weakness, no numbness and no tingling   Risk factors: obesity     Past Medical History  Diagnosis Date  . Thyroid disease   . Thyroid cancer   . Anemia    Past Surgical History  Procedure Laterality Date  . Appendectomy    . Thyroidectomy    . Bunionectomy    . Tonsillectomy     Family History  Problem Relation Age of Onset  . Hypertension Mother   . Thyroid disease Mother   . Hypertension Father    History  Substance Use Topics  . Smoking status: Never Smoker   . Smokeless tobacco: Never Used  . Alcohol Use: No   OB History   Grav Para Term Preterm Abortions TAB SAB Ect Mult Living                 Review of Systems    Musculoskeletal: Positive for stiffness. Negative for back pain.  All other systems reviewed and are negative.   Allergies  Review of patient's allergies indicates no known allergies.  Home Medications   Prior to Admission medications   Medication Sig Start Date End Date Taking? Authorizing Provider  albuterol (PROVENTIL HFA;VENTOLIN HFA) 108 (90 BASE) MCG/ACT inhaler Inhale 2 puffs into the lungs every 4 (four) hours as needed for wheezing (cough, shortness of breath or wheezing.). 11/16/12   Kandra Nicolas, MD  azithromycin (ZITHROMAX Z-PAK) 250 MG tablet Take 2 tabs today; then begin one tab once daily for 4 more days. (Rx void after 11/24/12) 11/16/12   Kandra Nicolas, MD  ferrous sulfate dried (SLOW FE) 160 (50 FE) MG TBCR Take 160 mg by mouth daily.    Historical Provider, MD  fluticasone (FLOVENT HFA) 44 MCG/ACT inhaler Inhale 2 puffs into the lungs 2 (two) times daily. 11/16/12   Kandra Nicolas, MD  HYDROcodone-homatropine [facility]) 5-1.5 MG/5ML syrup Take 5 mLs by mouth every 4 (four) hours as needed for cough. 01/21/13   Julianne Rice, MD  levothyroxine (SYNTHROID, LEVOTHROID) 137 MCG tablet Take 137 mcg by  mouth daily.    Historical Provider, MD  predniSONE (DELTASONE) 20 MG tablet Take 1 tablet (20 mg total) by mouth 2 (two) times daily. 11/16/12   Kandra Nicolas, MD  predniSONE (DELTASONE) 50 MG tablet Take 1 tablet (50 mg total) by mouth daily. 01/21/13   Julianne Rice, MD  ranitidine (ZANTAC) 150 MG tablet Take 150 mg by mouth 2 (two) times daily.    Historical Provider, MD   BP 160/103  Pulse 80  Temp(Src) 97.6 F (36.4 C) (Oral)  Resp 18  Ht 5' 5.5" (1.664 m)  Wt 203 lb (92.08 kg)  BMI 33.26 kg/m2  SpO2 97%  LMP 10/22/2011 Physical Exam  Nursing note and vitals reviewed. Constitutional: She is oriented to person, place, and time. She appears well-developed and well-nourished. No distress.  Patient is obese (BMI 33.3)  HENT:  Head: Atraumatic.  Eyes:  Conjunctivae are normal. Pupils are equal, round, and reactive to light.  Musculoskeletal:       Left knee: She exhibits swelling and abnormal meniscus. She exhibits normal range of motion, no ecchymosis, no deformity, no laceration, no erythema, normal alignment, no LCL laxity, normal patellar mobility, no bony tenderness and no MCL laxity. No tenderness found.       Legs: Left knee has relatively good range of motion:  She has mild difficulty with full active extension.  Possibly some mild swelling.  Knee is stable:  Negative drawer.  McMurray test positive for lateral meniscus.  She experiences pain laterally as noted on diagram, but there is no tenderness to palpation there.  Neurological: She is alert and oriented to person, place, and time.  Skin: Skin is warm and dry.    ED Course  Procedures  none      Imaging Review Dg Knee Complete 4 Views Left  05/23/2013   CLINICAL DATA:  Left knee pain with weight-bearing  EXAM: LEFT KNEE - COMPLETE 4+ VIEW  COMPARISON:  None.  FINDINGS: There is no evidence of fracture, dislocation, or joint effusion. There is osteoarthritic changes of the left patellofemoral compartment. Soft tissues are unremarkable.  IMPRESSION: 1. No acute osseous injury of the left knee. 2. 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   Electronically Signed   By: Kathreen Devoid   On: 05/23/2013 17:08     MDM   1. Sprain of left knee; suspect meniscus injury    Knee brace applied.  Dispensed crutches. Apply ice pack for 30 minutes every 1 to 3 hours today and tomorrow.  Elevate when possible.  Use crutches for 3 to 5 days.  Wear knee brace.   Begin leg raise exercises as per instruction sheet.  Continue Ibuprofen 200mg , 4 tabs every 8 hours with food.  Followup with Dr. Aundria Mems in 4 days.    Kandra Nicolas, MD 05/23/13 [phone]

## 2013-05-27 ENCOUNTER — Encounter: Payer: Self-pay | Admitting: Sports Medicine

## 2013-05-27 ENCOUNTER — Ambulatory Visit (INDEPENDENT_AMBULATORY_CARE_PROVIDER_SITE_OTHER): Payer: 59 | Admitting: Sports Medicine

## 2013-05-27 VITALS — BP 156/95 | HR 84 | Ht 65.0 in | Wt 204.0 lb

## 2013-05-27 DIAGNOSIS — M25569 Pain in unspecified knee: Secondary | ICD-10-CM

## 2013-05-27 DIAGNOSIS — M25562 Pain in left knee: Secondary | ICD-10-CM | POA: Insufficient documentation

## 2013-05-27 NOTE — Progress Notes (Signed)
   Subjective:    I'm seeing this patient as a consultation for:  Dr. Assunta Found  CC: Left knee pain  HPI: This is a pleasant 53 year old female, she recalls trying to step up into a van, and she placed weight on her leg she felt a pop and a grind, and had some pain and swelling. She was seen in urgent care, she has been taking NSAIDs, and doing topical cryotherapy. Overall improving significantly. Pain is mild, localized under the patella, worse going up and down stairs.  Past medical history, Surgical history, Family history not pertinant except as noted below, Social history, Allergies, and medications have been entered into the medical record, reviewed, and no changes needed.   Review of Systems: No headache, visual changes, nausea, vomiting, diarrhea, constipation, dizziness, abdominal pain, skin rash, fevers, chills, night sweats, weight loss, swollen lymph nodes, body aches, joint swelling, muscle aches, chest pain, shortness of breath, mood changes, visual or auditory hallucinations.   Objective:   General: Well Developed, well nourished, and in no acute distress.  Neuro/Psych: Alert and oriented x3, extra-ocular muscles intact, able to move all 4 extremities, sensation grossly intact. Skin: Warm and dry, no rashes noted.  Respiratory: Not using accessory muscles, speaking in full sentences, trachea midline.  Cardiovascular: Pulses palpable, no extremity edema. Abdomen: Does not appear distended. Left Knee: Normal to inspection with no erythema or effusion or obvious bony abnormalities. Palpation normal with no warmth, joint line tenderness, patellar tenderness, or condyle tenderness. ROM full in flexion and extension and lower leg rotation. Ligaments with solid consistent endpoints including ACL, PCL, LCL, MCL. Negative Mcmurray's, Apley's, and Thessalonian tests. Non painful patellar compression. Patellar glide with crepitus. Patellar and quadriceps tendons unremarkable. Hamstring  and quadriceps strength is normal.   X-rays show patellofemoral arthritis.  Impression and Recommendations:   This case required medical decision making of moderate complexity.

## 2013-05-27 NOTE — Assessment & Plan Note (Signed)
Pain is predominantly patellofemoral. X-rays are also suggestive. Continue ibuprofen 800 mg 3 times a day, bracing, patellofemoral rehabilitation. Return to see me in one month, no better we will pursue aggressive intervention and physical therapy.

## 2013-06-24 ENCOUNTER — Ambulatory Visit: Payer: 59 | Admitting: Sports Medicine

## 2013-06-24 DIAGNOSIS — Z0289 Encounter for other administrative examinations: Secondary | ICD-10-CM

## 2013-09-22 ENCOUNTER — Encounter (HOSPITAL_COMMUNITY): Payer: Self-pay | Admitting: Emergency Medicine

## 2013-09-22 ENCOUNTER — Emergency Department (HOSPITAL_COMMUNITY)
Admission: EM | Admit: 2013-09-22 | Discharge: 2013-09-22 | Disposition: A | Discharge status: Home / Self Care | Payer: 59 | Attending: Emergency Medicine | Admitting: Emergency Medicine

## 2013-09-22 DIAGNOSIS — M7989 Other specified soft tissue disorders: Secondary | ICD-10-CM

## 2013-09-22 DIAGNOSIS — M2540 Effusion, unspecified joint: Secondary | ICD-10-CM | POA: Diagnosis not present

## 2013-09-22 DIAGNOSIS — C73 Malignant neoplasm of thyroid gland: Secondary | ICD-10-CM

## 2013-09-22 DIAGNOSIS — Z85850 Personal history of malignant neoplasm of thyroid: Secondary | ICD-10-CM | POA: Diagnosis not present

## 2013-09-22 DIAGNOSIS — M25569 Pain in unspecified knee: Secondary | ICD-10-CM | POA: Diagnosis not present

## 2013-09-22 DIAGNOSIS — M25562 Pain in left knee: Secondary | ICD-10-CM

## 2013-09-22 DIAGNOSIS — Z862 Personal history of diseases of the blood and blood-forming organs and certain disorders involving the immune mechanism: Secondary | ICD-10-CM | POA: Insufficient documentation

## 2013-09-22 DIAGNOSIS — Z8639 Personal history of other endocrine, nutritional and metabolic disease: Secondary | ICD-10-CM | POA: Insufficient documentation

## 2013-09-22 DIAGNOSIS — Z79899 Other long term (current) drug therapy: Secondary | ICD-10-CM | POA: Diagnosis not present

## 2013-09-22 LAB — I-STAT CHEM 8, ED
BUN: 21 mg/dL (ref 6–23)
CALCIUM ION: 1.17 mmol/L (ref 1.12–1.23)
CHLORIDE: 105 meq/L (ref 96–112)
CREATININE: 0.8 mg/dL (ref 0.50–1.10)
GLUCOSE: 95 mg/dL (ref 70–99)
HEMATOCRIT: 45 % (ref 36.0–46.0)
Hemoglobin: 15.3 g/dL — ABNORMAL HIGH (ref 12.0–15.0)
Potassium: 3.9 mEq/L (ref 3.7–5.3)
Sodium: 140 mEq/L (ref 137–147)
TCO2: 25 mmol/L (ref 0–100)

## 2013-09-22 LAB — CK: Total CK: 170 U/L (ref 7–177)

## 2013-09-22 NOTE — Discharge Instructions (Signed)
Leg Cramps There is no evidence of blood clot. Use tylenol or ibuprofen as needed. Keep your leg elevated and followup with your doctor. Return to the ED if you develop new or worsening symptoms. Leg cramps that occur during exercise can be caused by poor circulation or dehydration. However, muscle cramps that occur at rest or during the night are usually not due to any serious medical problem. Heat cramps may cause muscle spasms during hot weather.  CAUSES There is no clear cause for muscle cramps. However, dehydration may be a factor for those who do not drink enough fluids and those who exercise in the heat. Imbalances in the level of sodium, potassium, calcium or magnesium in the muscle tissue may also be a factor. Some medications, such as water pills (diuretics), may cause loss of chemicals that the body needs (like sodium and potassium) and cause muscle cramps. TREATMENT   Make sure your diet has enough fluids and essential minerals for the muscle to work normally.  Avoid strenuous exercise for several days if you have been having frequent leg cramps.  Stretch and massage the cramped muscle for several minutes.  Some medicines may be helpful in some patients with night cramps. Only take over-the-counter or prescription medicines as directed by your caregiver. SEEK IMMEDIATE MEDICAL CARE IF:   Your leg cramps become worse.  Your foot becomes cold, numb, or blue. Document Released: 02/21/2004 Document Revised: 04/07/2011 Document Reviewed: 02/08/2008 [facility] Patient Information 2015 [place], [place]. This information is not intended to replace advice given to you by your health care provider. Make sure you discuss any questions you have with your health care provider.

## 2013-09-22 NOTE — ED Notes (Addendum)
Pt reports she has been taking long road trips. Drove 10 hours on 8/21 and 10 hours on 8/22. Also drove 8 hours on 8/24.pt has had left leg calf pain x5 days. Left calf and foot swollen, larger than right leg. Pain 2/10. Pt able to ambulate.

## 2013-09-22 NOTE — ED Provider Notes (Signed)
CSN: [id number]     Arrival date & time 09/22/13  1212 History   First MD Initiated Contact with Patient 09/22/13 1221     Chief Complaint  Patient presents with  . Leg Swelling     (Consider location/radiation/quality/duration/timing/severity/associated sxs/prior Treatment) HPI Comments: Patient complains of 2 day history of worsening left leg pain and swelling. Admits to recent prolonged car trips and travel. Denies any history of blood clots. Does have a remote history of thyroid cancer. No chest pain or shortness of breath. No fever or cough. No focal weakness, numbness or tingling. Pain slightly improved with naproxen. Range of motion of left knee and left ankle.  The history is provided by the patient.    Past Medical History  Diagnosis Date  . Thyroid disease   . Thyroid cancer   . Anemia    Past Surgical History  Procedure Laterality Date  . Appendectomy    . Thyroidectomy    . Bunionectomy    . Tonsillectomy     Family History  Problem Relation Age of Onset  . Hypertension Mother   . Thyroid disease Mother   . Hypertension Father    History  Substance Use Topics  . Smoking status: Never Smoker   . Smokeless tobacco: Never Used  . Alcohol Use: Yes     Comment: rarely   OB History   Grav Para Term Preterm Abortions TAB SAB Ect Mult Living                 Review of Systems  Constitutional: Negative for fever, activity change and appetite change.  Respiratory: Negative for cough and chest tightness.   Cardiovascular: Positive for leg swelling. Negative for chest pain.  Gastrointestinal: Negative for nausea, vomiting and abdominal pain.  Genitourinary: Negative for dysuria, hematuria, vaginal bleeding and vaginal discharge.  Musculoskeletal: Positive for joint swelling. Negative for arthralgias and myalgias.  Skin: Negative for rash.  Neurological: Negative for dizziness, weakness and headaches.  A complete 10 system review of systems was obtained and all  systems are negative except as noted in the HPI and PMH.      Allergies  Review of patient's allergies indicates no known allergies.  Home Medications   Prior to Admission medications   Medication Sig Start Date End Date Taking? Authorizing Provider  albuterol (PROVENTIL HFA;VENTOLIN HFA) 108 (90 BASE) MCG/ACT inhaler Inhale 2 puffs into the lungs every 4 (four) hours as needed for wheezing (cough, shortness of breath or wheezing.). 11/16/12  Yes Kandra Nicolas, MD  Cholecalciferol (VITAMIN D) 1000 UNITS capsule Take 2,000 Units by mouth daily.   Yes Historical Provider, MD  ibuprofen (ADVIL,MOTRIN) 200 MG tablet Take 200 mg by mouth every 6 (six) hours as needed (pain.).   Yes Historical Provider, MD  levothyroxine (SYNTHROID, LEVOTHROID) 137 MCG tablet Take 137-205.5 mcg by mouth daily. Monday through Saturday take 137 mcg once daily.  On Sunday take 205.5 mcg.   Yes Historical Provider, MD  naproxen sodium (ANAPROX) 220 MG tablet Take 220 mg by mouth at bedtime as needed (pain.).   Yes Historical Provider, MD   BP 156/88  Pulse 81  Temp(Src) 98.1 F (36.7 C) (Oral)  Resp 18  SpO2 100%  LMP 10/22/2011 Physical Exam  Nursing note and vitals reviewed. Constitutional: She is oriented to person, place, and time. She appears well-developed and well-nourished. No distress.  HENT:  Head: Normocephalic and atraumatic.  Mouth/Throat: Oropharynx is clear and moist. No oropharyngeal exudate.  Eyes:  Conjunctivae and EOM are normal. Pupils are equal, round, and reactive to light.  Neck: Normal range of motion. Neck supple.  No meningismus.  Cardiovascular: Normal rate, regular rhythm, normal heart sounds and intact distal pulses.   No murmur heard. Pulmonary/Chest: Effort normal and breath sounds normal. No respiratory distress.  Abdominal: Soft. There is no tenderness. There is no rebound and no guarding.  Musculoskeletal: Normal range of motion. She exhibits edema. She exhibits no  tenderness.  Bilateral LE edema L>R No palpable cords.  +2 DP and PT pulses,  Compartments soft.  Neurological: She is alert and oriented to person, place, and time. No cranial nerve deficit. She exhibits normal muscle tone. Coordination normal.  No ataxia on finger to nose bilaterally. No pronator drift. 5/5 strength throughout. CN 2-12 intact. Negative Romberg. Equal grip strength. Sensation intact. Gait is normal.   Skin: Skin is warm.  Psychiatric: She has a normal mood and affect. Her behavior is normal.    ED Course  Procedures (including critical care time) Labs Review Labs Reviewed  I-STAT CHEM 8, ED - Abnormal; Notable for the following:    Hemoglobin 15.3 (*)    All other components within normal limits  CK    Imaging Review No results found.   EKG Interpretation None      MDM   Final diagnoses:  Left knee pain  Thyroid cancer   Leg swelling for the past 2 days setting of recent car travel. No chest pain or shortness of breath. History of cancer. neurovascularly intact.  Doppler is negative for DVT. no hypoxia, tachycardia, chest pain or shortness of breath to suggest PE. Chem 7 and CK normal.   Discussed symptom control and elevation of leg. Follow up with PCP. Return precautions discussed.      Ezequiel Essex, MD 09/22/13 1505

## 2013-09-22 NOTE — Progress Notes (Signed)
Left lower extremity venous duplex completed.  Left:  No evidence of DVT, superficial thrombosis, or Baker's cyst.  Right:  Negative for DVT in the common femoral vein.  

## 2013-12-15 ENCOUNTER — Encounter: Payer: Self-pay | Admitting: *Deleted

## 2013-12-15 ENCOUNTER — Emergency Department
Admission: EM | Admit: 2013-12-15 | Discharge: 2013-12-15 | Disposition: A | Discharge status: Home / Self Care | Payer: 59 | Source: Home / Self Care | Attending: Emergency Medicine | Admitting: Emergency Medicine

## 2013-12-15 DIAGNOSIS — J45990 Exercise induced bronchospasm: Secondary | ICD-10-CM

## 2013-12-15 DIAGNOSIS — J302 Other seasonal allergic rhinitis: Secondary | ICD-10-CM

## 2013-12-15 HISTORY — DX: Unspecified asthma, uncomplicated: J45.909

## 2013-12-15 MED ORDER — ALBUTEROL SULFATE HFA 108 (90 BASE) MCG/ACT IN AERS: 2.0000 | INHALATION_SPRAY | RESPIRATORY_TRACT | Status: DC | PRN

## 2013-12-15 MED ORDER — FLUTICASONE PROPIONATE 50 MCG/ACT NA SUSP: NASAL | Status: DC

## 2013-12-15 NOTE — ED Provider Notes (Signed)
CSN: [id number]     Arrival date & time 12/15/13  1000 History   First MD Initiated Contact with Patient 12/15/13 1023     Chief Complaint  Patient presents with  . 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 She has been exercising more frequently recently. She is now out of her inhaler. Apt with new PCP @ [facility] in January. They will not prescribed inhaler until she is seen.  Has seasonal allergic rhinitis symptoms of nasal stuffiness and clear nasal drainage. No discolored rhinorrhea or fever or facial pain. Denies chest pain or shortness of breath or wheezing. The only time she gets wheezing is during vigorous exercise class. Denies focal neurologic symptoms. No lightheadedness or syncope. No abdominal or GI symptoms. Past Medical History  Diagnosis Date  . Thyroid disease   . Thyroid cancer   . Anemia   . Asthma    Past Surgical History  Procedure Laterality Date  . Appendectomy    . Thyroidectomy    . Bunionectomy    . Tonsillectomy     Family History  Problem Relation Age of Onset  . Hypertension Mother   . Thyroid disease Mother   . Hypertension Father    History  Substance Use Topics  . Smoking status: Never Smoker   . Smokeless tobacco: Never Used  . Alcohol Use: Yes     Comment: rarely   OB History    No data available     Review of Systems  All other systems reviewed and are negative.   Allergies  Review of patient's allergies indicates no known allergies.  Home Medications   Prior to Admission medications   Medication Sig Start Date End Date Taking? Authorizing Provider  albuterol (PROVENTIL HFA;VENTOLIN HFA) 108 (90 BASE) MCG/ACT inhaler Inhale 2 puffs into the lungs every 4 (four) hours as needed for wheezing (cough, shortness of breath or wheezing.). 11/16/12   Kandra Nicolas, MD  albuterol (PROVENTIL HFA;VENTOLIN  HFA) 108 (90 BASE) MCG/ACT inhaler Inhale 2 puffs into the lungs every 4 (four) hours as needed for wheezing or shortness of breath. 2puffs before exercise to prevent wheezing 12/15/13   Jacqulyn Cane, MD  Cholecalciferol (VITAMIN D) 1000 UNITS capsule Take 2,000 Units by mouth daily.    Historical Provider, MD  fluticasone Asencion Islam) 50 MCG/ACT nasal spray 1 or 2 sprays each nostril twice a day 12/15/13   Jacqulyn Cane, MD  ibuprofen (ADVIL,MOTRIN) 200 MG tablet Take 200 mg by mouth every 6 (six) hours as needed (pain.).    Historical Provider, MD  levothyroxine (SYNTHROID, LEVOTHROID) 137 MCG tablet Take 137-205.5 mcg by mouth daily. Monday through Saturday take 137 mcg once daily.  On Sunday take 205.5 mcg.    Historical Provider, MD  naproxen sodium (ANAPROX) 220 MG tablet Take 220 mg by mouth at bedtime as needed (pain.).    Historical Provider, MD   BP 157/93 mmHg  Pulse 80  Temp(Src) 97.8 F (36.6 C) (Oral)  Resp 16  Wt 202 lb (91.627 kg)  SpO2 97%  LMP 10/22/2011 Physical Exam  Constitutional: She is oriented to person, place, and time. She appears well-developed and well-nourished. No distress.  HENT:  Head: Normocephalic and atraumatic.  Mouth/Throat: Oropharynx is clear and moist.  Boggy pale turbinates, serous drainage  Eyes: Conjunctivae and EOM are normal. Pupils are equal, round, and reactive to light. No scleral icterus.  Neck: Normal range of  motion.  Cardiovascular: Normal rate, regular rhythm and normal heart sounds.   Pulmonary/Chest: Effort normal and breath sounds normal. She has no wheezes.  Abdominal: She exhibits no distension.  Musculoskeletal: Normal range of motion.  Lymphadenopathy:    She has no cervical adenopathy.  Neurological: She is alert and oriented to person, place, and time.  Skin: Skin is warm. No rash noted.  Psychiatric: She has a normal mood and affect.  Nursing note and vitals reviewed.   ED Course  Procedures (including critical care  time) Labs Review Labs Reviewed - No data to display  Imaging Review No results found.   MDM   1. Exercise-induced asthma   2. Other seasonal allergic rhinitis    Treatment options discussed, as well as risks, benefits, alternatives. Patient voiced understanding and agreement with the following plans: Refilled albuterol HFA for prn use. 2 inhalations before exercise to prevent exercise-induced asthma. Flonase prescribed OTC Claritin or Zyrtec Other advice given. Follow-up with PCP. She states BP is usually 120/80 when it's checked at work.     Jacqulyn Cane, MD 12/15/13 1058

## 2013-12-15 NOTE — ED Notes (Signed)
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 She has been exercising more frequently recently. She is now out of her inhaler. Apt with new PCP @ [facility] in January. They will not prescribed inhaler until she is seen.

## 2014-02-01 ENCOUNTER — Ambulatory Visit (INDEPENDENT_AMBULATORY_CARE_PROVIDER_SITE_OTHER): Payer: 59 | Admitting: Family

## 2014-02-01 ENCOUNTER — Encounter: Payer: Self-pay | Admitting: Family

## 2014-02-01 VITALS — BP 140/84 | HR 75 | Temp 98.0°F | Resp 18 | Ht 64.0 in | Wt 205.8 lb

## 2014-02-01 DIAGNOSIS — J4540 Moderate persistent asthma, uncomplicated: Secondary | ICD-10-CM

## 2014-02-01 DIAGNOSIS — D649 Anemia, unspecified: Secondary | ICD-10-CM

## 2014-02-01 DIAGNOSIS — Z85850 Personal history of malignant neoplasm of thyroid: Secondary | ICD-10-CM

## 2014-02-01 DIAGNOSIS — Z0000 Encounter for general adult medical examination without abnormal findings: Secondary | ICD-10-CM

## 2014-02-01 DIAGNOSIS — B351 Tinea unguium: Secondary | ICD-10-CM

## 2014-02-01 DIAGNOSIS — Z5181 Encounter for therapeutic drug level monitoring: Secondary | ICD-10-CM

## 2014-02-01 DIAGNOSIS — Z23 Encounter for immunization: Secondary | ICD-10-CM

## 2014-02-01 LAB — BASIC METABOLIC PANEL
BUN: 25 mg/dL — AB (ref 6–23)
CALCIUM: 9.2 mg/dL (ref 8.4–10.5)
CHLORIDE: 108 meq/L (ref 96–112)
CO2: 26 meq/L (ref 19–32)
CREATININE: 0.8 mg/dL (ref 0.4–1.2)
GFR: 77.46 mL/min (ref >60.00)
GLUCOSE: 93 mg/dL (ref 70–99)
Potassium: 3.8 mEq/L (ref 3.5–5.1)
Sodium: 138 mEq/L (ref 135–145)

## 2014-02-01 LAB — CBC WITH DIFFERENTIAL/PLATELET
Basophils Absolute: 0 10*3/uL (ref 0.0–0.1)
Basophils Relative: 0.3 % (ref 0.0–3.0)
Eosinophils Absolute: 0.3 10*3/uL (ref 0.0–0.7)
Eosinophils Relative: 6.1 % — ABNORMAL HIGH (ref 0.0–5.0)
HEMATOCRIT: 44.1 % (ref 36.0–46.0)
HEMOGLOBIN: 14.5 g/dL (ref 12.0–15.0)
LYMPHS PCT: 27.7 % (ref 12.0–46.0)
Lymphs Abs: 1.4 10*3/uL (ref 0.7–4.0)
MCHC: 32.9 g/dL (ref 30.0–36.0)
MCV: 92.4 fl (ref 78.0–100.0)
Monocytes Absolute: 0.4 10*3/uL (ref 0.1–1.0)
Monocytes Relative: 6.9 % (ref 3.0–12.0)
NEUTROS ABS: 3 10*3/uL (ref 1.4–7.7)
NEUTROS PCT: 59 % (ref 43.0–77.0)
PLATELETS: 170 10*3/uL (ref 150.0–400.0)
RBC: 4.78 Mil/uL (ref 3.87–5.11)
RDW: 13.7 % (ref 11.5–15.5)
WBC: 5.1 10*3/uL (ref 4.0–10.5)

## 2014-02-01 LAB — HEPATIC FUNCTION PANEL
ALT: 27 U/L (ref 0–35)
AST: 20 U/L (ref 0–37)
Albumin: 4 g/dL (ref 3.5–5.2)
Alkaline Phosphatase: 61 U/L (ref 39–117)
Bilirubin, Direct: 0.1 mg/dL (ref 0.0–0.3)
Total Bilirubin: 0.8 mg/dL (ref 0.2–1.2)
Total Protein: 6.8 g/dL (ref 6.0–8.3)

## 2014-02-01 MED ORDER — BUDESONIDE-FORMOTEROL FUMARATE 80-4.5 MCG/ACT IN AERO: 2.0000 | INHALATION_SPRAY | Freq: Two times a day (BID) | RESPIRATORY_TRACT | Status: DC

## 2014-02-01 MED ORDER — ALBUTEROL SULFATE HFA 108 (90 BASE) MCG/ACT IN AERS: 2.0000 | INHALATION_SPRAY | RESPIRATORY_TRACT | Status: DC | PRN

## 2014-02-01 MED ORDER — TERBINAFINE HCL 250 MG PO TABS: 250.0000 mg | ORAL_TABLET | Freq: Every day | ORAL | Status: DC

## 2014-02-01 NOTE — Progress Notes (Signed)
Pre visit review using our clinic review tool, if applicable. No additional management support is needed unless otherwise documented below in the visit note. 

## 2014-02-01 NOTE — Patient Instructions (Signed)
Please complete your lab work prior to leaving. Start lamisil. Follow up in 6 weeks for fasting physical.

## 2014-02-01 NOTE — Progress Notes (Signed)
Subjective:    Patient ID: April Stone, female    DOB: Apr 28, 1960, 54 y.o.   MRN: [id number]  HPI  April Stone is a 54 yr old female who presents today to re-establish care. She was previously followed by Dr. Shawna Orleans, but has not been seen since 2011.   1) Hx of thyroid CA- Was diagnosed in 2006.  Reports "stage IV". Had thyroidectomy and radiation treatment. Followed by Dr. Posey Pronto at [facility] who she saw yesterday- he manages her synthroid.   2) Asthma- Reports that she had an episode of bronchitis last month.  Now using albuterol daily and before/after exercise.    3) Anemia- reports that this was in issue when she was menstruating. Was taking iron, but stopped 6 months ago which was the time of her last period.  4) onychomycosis- reports that she has had thickening x 3 years. Tried home remedies without improvement.       Review of Systems  Constitutional: Negative for unexpected weight change.  HENT: Negative for rhinorrhea.   Respiratory: Negative for cough and shortness of breath.   Cardiovascular: Negative for chest pain.  Gastrointestinal: Negative for vomiting, diarrhea and constipation.  Genitourinary: Negative for dysuria.  Musculoskeletal: Negative for myalgias and arthralgias.  Skin: Negative for rash.  Neurological: Negative for headaches.  Psychiatric/Behavioral:       Denies depression/anxiety   See HPI  Past Medical History  Diagnosis Date  . Thyroid disease   . Thyroid cancer   . Anemia   . Asthma     History   Social History  . Marital Status: Married    Spouse Name: N/A    Number of Children: N/A  . Years of Education: N/A   Occupational History  . Not on file.   Social History Main Topics  . Smoking status: Never Smoker   . Smokeless tobacco: Never Used  . Alcohol Use: 0.0 oz/week    0 Not specified per week     Comment: rarely  . Drug Use: No  . Sexual Activity: Not on file   Other Topics Concern  . Not on file   Social History  Narrative    Past Surgical History  Procedure Laterality Date  . Appendectomy    . Thyroidectomy    . Bunionectomy    . Tonsillectomy      Family History  Problem Relation Age of Onset  . Thyroid disease Mother   . Hypertension Father   . Atrial fibrillation Maternal Grandmother     No Known Allergies  Current Outpatient Prescriptions on File Prior to Visit  Medication Sig Dispense Refill  . Cholecalciferol (VITAMIN D) 1000 UNITS capsule Take 2,000 Units by mouth daily.    . fluticasone (FLONASE) 50 MCG/ACT nasal spray 1 or 2 sprays each nostril twice a day 16 g 2  . ibuprofen (ADVIL,MOTRIN) 200 MG tablet Take 200 mg by mouth every 6 (six) hours as needed (pain.).    [name] levothyroxine (SYNTHROID, LEVOTHROID) 137 MCG tablet Take 137-205.5 mcg by mouth daily. Monday through Saturday take 137 mcg once daily.  On Sunday take 205.5 mcg.     No current facility-administered medications on file prior to visit.    BP 140/84 mmHg  Pulse 75  Temp(Src) 98 F (36.7 C) (Oral)  Resp 18  Ht 5\' 4"  (1.626 m)  Wt 205 lb 12.8 oz (93.35 kg)  BMI 35.31 kg/m2  SpO2 97%  LMP 08/01/2013       Objective:  Physical Exam  Constitutional: She is oriented to person, place, and time. She appears well-developed and well-nourished. No distress.  HENT:  Head: Normocephalic and atraumatic.  Cardiovascular: Normal rate and regular rhythm.   No murmur heard. Pulmonary/Chest: Effort normal and breath sounds normal. No respiratory distress. She has no wheezes. She has no rales. She exhibits no tenderness.  Neurological: She is alert and oriented to person, place, and time.  Skin: Skin is warm and dry.  Toenails thickened bilaterally  Psychiatric: She has a normal mood and affect. Her behavior is normal. Judgment and thought content normal.            Assessment & Plan:

## 2014-02-02 DIAGNOSIS — B351 Tinea unguium: Secondary | ICD-10-CM | POA: Insufficient documentation

## 2014-02-02 NOTE — Assessment & Plan Note (Signed)
Lab Results  Component Value Date   WBC 5.1 02/01/2014   HGB 14.5 02/01/2014   HCT 44.1 02/01/2014   MCV 92.4 02/01/2014   PLT 170.0 02/01/2014   Follow up CBC is now in normal range.

## 2014-02-02 NOTE — Assessment & Plan Note (Signed)
Clinically stable, managed by Dr. Posey Pronto (endo)

## 2014-02-02 NOTE — Assessment & Plan Note (Signed)
Uncontrolled. Due to daily symptoms will add symbicort for maintence.

## 2014-02-02 NOTE — Assessment & Plan Note (Signed)
Rx with PO lamisil. Plan repeat lft in 6 weeks.

## 2014-03-15 ENCOUNTER — Encounter: Payer: Self-pay | Admitting: Family

## 2014-03-15 ENCOUNTER — Ambulatory Visit (INDEPENDENT_AMBULATORY_CARE_PROVIDER_SITE_OTHER): Payer: 59 | Admitting: Family

## 2014-03-15 ENCOUNTER — Other Ambulatory Visit (HOSPITAL_COMMUNITY)
Admission: RE | Admit: 2014-03-15 | Discharge: 2014-03-15 | Disposition: A | Discharge status: Home / Self Care | Payer: 59 | Source: Ambulatory Visit | Attending: Family | Admitting: Family

## 2014-03-15 VITALS — BP 130/90 | HR 78 | Temp 97.8°F | Resp 16 | Ht 64.0 in | Wt 203.6 lb

## 2014-03-15 DIAGNOSIS — Z1151 Encounter for screening for human papillomavirus (HPV): Secondary | ICD-10-CM | POA: Diagnosis present

## 2014-03-15 DIAGNOSIS — Z01419 Encounter for gynecological examination (general) (routine) without abnormal findings: Secondary | ICD-10-CM

## 2014-03-15 DIAGNOSIS — Z0000 Encounter for general adult medical examination without abnormal findings: Secondary | ICD-10-CM | POA: Insufficient documentation

## 2014-03-15 LAB — URINALYSIS, ROUTINE W REFLEX MICROSCOPIC
Bilirubin Urine: NEGATIVE
Ketones, ur: NEGATIVE
Leukocytes, UA: NEGATIVE
Nitrite: NEGATIVE
Specific Gravity, Urine: 1.02 (ref 1.000–1.030)
Total Protein, Urine: NEGATIVE
UROBILINOGEN UA: 0.2 (ref 0.0–1.0)
Urine Glucose: NEGATIVE
pH: 6 (ref 5.0–8.0)

## 2014-03-15 LAB — HEPATIC FUNCTION PANEL
ALK PHOS: 78 U/L (ref 39–117)
ALT: 23 U/L (ref 0–35)
AST: 19 U/L (ref 0–37)
Albumin: 4.3 g/dL (ref 3.5–5.2)
BILIRUBIN DIRECT: 0.1 mg/dL (ref 0.0–0.3)
TOTAL PROTEIN: 7.4 g/dL (ref 6.0–8.3)
Total Bilirubin: 0.5 mg/dL (ref 0.2–1.2)

## 2014-03-15 LAB — LIPID PANEL
CHOL/HDL RATIO: 2
Cholesterol: 209 mg/dL — ABNORMAL HIGH (ref 0–200)
HDL: 87.3 mg/dL (ref >39.00)
LDL Cholesterol: 109 mg/dL — ABNORMAL HIGH (ref 0–99)
NONHDL: 121.7
Triglycerides: 62 mg/dL (ref 0.0–149.0)
VLDL: 12.4 mg/dL (ref 0.0–40.0)

## 2014-03-15 NOTE — Assessment & Plan Note (Signed)
Continue healthy diet, exercise, weight loss efforts.  Pap performed today, refer for mammo and colo.

## 2014-03-15 NOTE — Patient Instructions (Addendum)
Please complete lab work prior to leaving You will be contacted about scheduling your mammogram and colonoscopy. Follow up in 6 months.

## 2014-03-15 NOTE — Progress Notes (Signed)
Pre visit review using our clinic review tool, if applicable. No additional management support is needed unless otherwise documented below in the visit note. 

## 2014-03-15 NOTE — Progress Notes (Signed)
Subjective:    Patient ID: April Stone, female    DOB: Mar 19, 1960, 54 y.o.   MRN: [id number]  HPI April Stone presents today for complete physical.  Immunizations: Flu vaccine 09/2013. Tdap 01/2014 Diet: trying to lose weight. Has stopped soft drinks and is trying to eat fruits and vegetables. Exercise: doing Crossfit 3 times per week. Started 3 months ago.  Colonoscopy: has not had one. Dexa: 2007 Pap Smear: 2011 Mammogram: 2011   1. Asthma: Maintained on Symbicort 2 puffs BID but does admit to not taking it about once weekly.  Uses albuterol inhaler before she exercises. Reports no night time waking with SOB.  2. Hypothyroidism secondary to past thyroid cancer. Sees endocrinology-Dr. Posey Pronto.  Review of Systems  Constitutional: Negative for fever, fatigue and unexpected weight change.  HENT: Positive for congestion.   Eyes: Negative for visual disturbance.  Respiratory: Negative for cough and shortness of breath.   Cardiovascular: Positive for palpitations (about once every couple of months-lasts only a few seconds.) and leg swelling (after she has ben on her feet or riding in car.). Negative for chest pain.       Denies orthopnea.  Gastrointestinal: Positive for diarrhea (Occasional.). Negative for abdominal pain and constipation.  Genitourinary: Negative for dysuria and frequency.       Some stress incontinence. Has some hot flashes and has not had period in 6 months.  Musculoskeletal: Negative for myalgias and arthralgias.  Skin: Negative for rash.       Sometimes gets rashes from sweating under breasts.  Neurological: Negative for syncope and headaches.  Hematological: Negative for adenopathy.  Psychiatric/Behavioral: Positive for sleep disturbance (Reports some sleep disturbance-husband snores. Gets 6-7 hours per nights and 3-4 hours other nights.). Negative for suicidal ideas.       Reports some sadness and anxiety but feels like she manages it well. Husband has  terminal prostate cancer. Trial is coming up soon for person who murdered her son.   Past Medical History  Diagnosis Date  . Thyroid disease   . Thyroid cancer   . Anemia   . Asthma     History   Social History  . Marital Status: Married    Spouse Name: N/A  . Number of Children: N/A  . Years of Education: N/A   Occupational History  . Not on file.   Social History Main Topics  . Smoking status: Never Smoker   . Smokeless tobacco: Never Used  . Alcohol Use: 0.0 oz/week    0 Standard drinks or equivalent per week     Comment: rarely  . Drug Use: No  . Sexual Activity: Not on file   Other Topics Concern  . Not on file   Social History Narrative    Past Surgical History  Procedure Laterality Date  . Appendectomy    . Thyroidectomy    . Bunionectomy    . Tonsillectomy      Family History  Problem Relation Age of Onset  . Thyroid disease Mother   . Hypertension Father   . Atrial fibrillation Maternal Grandmother     No Known Allergies  Current Outpatient Prescriptions on File Prior to Visit  Medication Sig Dispense Refill  . albuterol (PROVENTIL HFA;VENTOLIN HFA) 108 (90 BASE) MCG/ACT inhaler Inhale 2 puffs into the lungs every 4 (four) hours as needed for wheezing or shortness of breath. 2puffs before exercise to prevent wheezing 18 g 2  . budesonide-formoterol (SYMBICORT) 80-4.5 MCG/ACT inhaler Inhale 2 puffs into  the lungs 2 (two) times daily. 1 Inhaler 3  . Cholecalciferol (VITAMIN D) 1000 UNITS capsule Take 2,000 Units by mouth daily.    . fluticasone (FLONASE) 50 MCG/ACT nasal spray 1 or 2 sprays each nostril twice a day 16 g 2  . ibuprofen (ADVIL,MOTRIN) 200 MG tablet Take 200 mg by mouth every 6 (six) hours as needed (pain.).    [name] levothyroxine (SYNTHROID, LEVOTHROID) 137 MCG tablet Take 137 mcg by mouth daily.     [name] terbinafine (LAMISIL) 250 MG tablet Take 1 tablet (250 mg total) by mouth daily. 30 tablet 1   No current facility-administered  medications on file prior to visit.    BP 130/90 mmHg  Pulse 78  Temp(Src) 97.8 F (36.6 C) (Oral)  Resp 16  Ht 5\' 4"  (1.626 m)  Wt 203 lb 9.6 oz (92.352 kg)  BMI 34.93 kg/m2  SpO2 99%  LMP 08/01/2013      Objective:   Physical Exam Physical Exam  Constitutional: She is oriented to person, place, and time. She appears well-developed and well-nourished. No distress.  HENT:  Head: Normocephalic and atraumatic.  Right Ear: Tympanic membrane and ear canal normal.  Left Ear: Tympanic membrane and ear canal normal.  Mouth/Throat: Oropharynx is clear and moist.  Eyes: Pupils are equal, round, and reactive to light. No scleral icterus.  Neck: Normal range of motion. No thyromegaly present.  Cardiovascular: Normal rate and regular rhythm.   No murmur heard. Pulmonary/Chest: Effort normal and breath sounds normal. No respiratory distress. He has no wheezes. She has no rales. She exhibits no tenderness.  Abdominal: Soft. Bowel sounds are normal. He exhibits no distension and no mass. There is no tenderness. There is no rebound and no guarding.  Musculoskeletal: She exhibits no edema.  Lymphadenopathy:    She has no cervical adenopathy.  Neurological: She is alert and oriented to person, place, and time. She has normal patellar reflexes. She exhibits normal muscle tone. Coordination normal.  Skin: Skin is warm and dry.  Psychiatric: She has a normal mood and affect. Her behavior is normal. Judgment and thought content normal.  Breasts: Examined lying Right: Without masses, retractions, discharge or axillary adenopathy.  Left: Without masses, retractions, discharge or axillary adenopathy.  Inguinal/mons: Normal without inguinal adenopathy  External genitalia: Normal  BUS/Urethra/Skene's glands: Normal  Bladder: Normal  Vagina: Normal  Cervix: Normal  Uterus: normal in size, shape and contour. Midline and mobile  Adnexa/parametria:  Rt: Without masses or tenderness.  Lt: Without  masses or tenderness.  Anus and perineum: Normal           Assessment & Plan:          Assessment & Plan:

## 2014-03-16 ENCOUNTER — Encounter: Payer: Self-pay | Admitting: Family

## 2014-03-16 ENCOUNTER — Encounter: Payer: Self-pay | Admitting: Internal Medicine

## 2014-03-16 LAB — CYTOLOGY - PAP

## 2014-03-17 ENCOUNTER — Encounter: Payer: Self-pay | Admitting: Family

## 2014-05-17 ENCOUNTER — Encounter: Payer: 59 | Admitting: Internal Medicine

## 2014-08-09 ENCOUNTER — Emergency Department
Admission: EM | Admit: 2014-08-09 | Discharge: 2014-08-09 | Disposition: A | Discharge status: Home / Self Care | Payer: 59 | Source: Home / Self Care | Attending: Family Medicine | Admitting: Family Medicine

## 2014-08-09 ENCOUNTER — Encounter: Payer: Self-pay | Admitting: *Deleted

## 2014-08-09 DIAGNOSIS — L03031 Cellulitis of right toe: Secondary | ICD-10-CM | POA: Diagnosis not present

## 2014-08-09 MED ORDER — DOXYCYCLINE HYCLATE 100 MG PO CAPS: 100.0000 mg | ORAL_CAPSULE | Freq: Two times a day (BID) | ORAL | Status: DC

## 2014-08-09 NOTE — ED Notes (Signed)
Pt c/o RT great toe infection x 3 days.

## 2014-08-09 NOTE — ED Provider Notes (Signed)
CSN: [id number]     Arrival date & time 08/09/14  1752 History   First MD Initiated Contact with Patient 08/09/14 1801     Chief Complaint  Patient presents with  . Toe Pain     HPI Comments: Patient complains of pain/swelling in her right great toe for 3 to 4 days, and believes that she has an infected toenail.  Patient is a 54 y.o. female presenting with toe pain. The history is provided by the patient.  Toe Pain This is a new problem. Episode onset: 3 to 4 days ago. The problem occurs constantly. The problem has been gradually worsening. The symptoms are aggravated by walking. Nothing relieves the symptoms. Treatments tried: warm soaks. The treatment provided mild relief.    Past Medical History  Diagnosis Date  . Thyroid disease   . Thyroid cancer   . Anemia   . Asthma   . Thyroid cancer    Past Surgical History  Procedure Laterality Date  . Appendectomy    . Thyroidectomy    . Bunionectomy    . Tonsillectomy     Family History  Problem Relation Age of Onset  . Thyroid disease Mother   . Hypertension Father   . Atrial fibrillation Maternal Grandmother    History  Substance Use Topics  . Smoking status: Never Smoker   . Smokeless tobacco: Never Used  . Alcohol Use: 0.0 oz/week    0 Standard drinks or equivalent per week     Comment: rarely   OB History    No data available     Review of Systems  All other systems reviewed and are negative.   Allergies  Review of patient's allergies indicates no known allergies.  Home Medications   Prior to Admission medications   Medication Sig Start Date End Date Taking? Authorizing Provider  albuterol (PROVENTIL HFA;VENTOLIN HFA) 108 (90 BASE) MCG/ACT inhaler Inhale 2 puffs into the lungs every 4 (four) hours as needed for wheezing or shortness of breath. 2puffs before exercise to prevent wheezing 02/01/14   Debbrah Alar, NP  budesonide-formoterol (SYMBICORT) 80-4.5 MCG/ACT inhaler Inhale 2 puffs into the lungs 2  (two) times daily. 02/01/14   Debbrah Alar, NP  Cholecalciferol (VITAMIN D) 1000 UNITS capsule Take 2,000 Units by mouth daily.    Historical Provider, MD  doxycycline (VIBRAMYCIN) 100 MG capsule Take 1 capsule (100 mg total) by mouth 2 (two) times daily. Take with food. 08/09/14   Kandra Nicolas, MD  fluticasone Asencion Islam) 50 MCG/ACT nasal spray 1 or 2 sprays each nostril twice a day 12/15/13   Jacqulyn Cane, MD  ibuprofen (ADVIL,MOTRIN) 200 MG tablet Take 200 mg by mouth every 6 (six) hours as needed (pain.).    Historical Provider, MD  levothyroxine (SYNTHROID, LEVOTHROID) 137 MCG tablet Take 137 mcg by mouth daily.     Historical Provider, MD  terbinafine (LAMISIL) 250 MG tablet Take 1 tablet (250 mg total) by mouth daily. 02/01/14   Debbrah Alar, NP   BP 153/96 mmHg  Pulse 89  Temp(Src) 98.2 F (36.8 C) (Oral)  Resp 18  Ht 5\' 5"  (1.651 m)  Wt 205 lb (92.987 kg)  BMI 34.11 kg/m2  SpO2 96%  LMP 08/01/2013 Physical Exam  Constitutional: She is oriented to person, place, and time. She appears well-developed and well-nourished. No distress.  HENT:  Head: Normocephalic.  Eyes: Pupils are equal, round, and reactive to light.  Musculoskeletal:       Right foot: There is normal  range of motion.       Feet:  Lateral edge of the right great toe is erythematous, mildly swollen, and tender to palpation.  No fluctuance.  Cap refill is adequate.  Neurological: She is alert and oriented to person, place, and time.  Skin: Skin is warm and dry.    ED Course  Procedures  none   MDM   1. Paronychia of great toe, right    Begin doxycycline 100mg  BID for staph coverage. Continue warm soaks. Followup with Family Doctor if not improved in 10 days.    Kandra Nicolas, MD 08/11/14 [phone]

## 2014-08-09 NOTE — Discharge Instructions (Signed)
Continue warm soaks.   Paronychia Paronychia is an inflammatory reaction involving the folds of the skin surrounding the fingernail. This is commonly caused by an infection in the skin around a nail. The most common cause of paronychia is frequent wetting of the hands (as seen with bartenders, food servers, nurses or others who wet their hands). This makes the skin around the fingernail susceptible to infection by bacteria (germs) or fungus. Other predisposing factors are:  Aggressive manicuring.  Nail biting.  Thumb sucking. The most common cause is a staphylococcal (a type of germ) infection, or a fungal (Candida) infection. When caused by a germ, it usually comes on suddenly with redness, swelling, pus and is often painful. It may get under the nail and form an abscess (collection of pus), or form an abscess around the nail. If the nail itself is infected with a fungus, the treatment is usually prolonged and may require oral medicine for up to one year. Your caregiver will determine the length of time treatment is required. The paronychia caused by bacteria (germs) may largely be avoided by not pulling on hangnails or picking at cuticles. When the infection occurs at the tips of the finger it is called felon. When the cause of paronychia is from the herpes simplex virus (HSV) it is called herpetic whitlow. TREATMENT  When an abscess is present treatment is often incision and drainage. This means that the abscess must be cut open so the pus can get out. When this is done, the following home care instructions should be followed. HOME CARE INSTRUCTIONS   It is important to keep the affected fingers very dry. Rubber or plastic gloves over cotton gloves should be used whenever the hand must be placed in water.  Keep wound clean, dry and dressed as suggested by your caregiver between warm soaks or warm compresses.  Soak in warm water for fifteen to twenty minutes three to four times per day for  bacterial infections. Fungal infections are very difficult to treat, so often require treatment for long periods of time.  For bacterial (germ) infections take antibiotics (medicine which kill germs) as directed and finish the prescription, even if the problem appears to be solved before the medicine is gone.  Only take over-the-counter or prescription medicines for pain, discomfort, or fever as directed by your caregiver. SEEK IMMEDIATE MEDICAL CARE IF:  You have redness, swelling, or increasing pain in the wound.  You notice pus coming from the wound.  You have a fever.  You notice a bad smell coming from the wound or dressing. Document Released: 07/09/2000 Document Revised: 04/07/2011 Document Reviewed: 03/10/2008 [facility] Patient Information 2015 [place], [place]. This information is not intended to replace advice given to you by your health care provider. Make sure you discuss any questions you have with your health care provider.

## 2014-09-13 ENCOUNTER — Telehealth: Payer: Self-pay | Admitting: Family

## 2014-09-13 ENCOUNTER — Ambulatory Visit: Payer: 59 | Admitting: Family

## 2014-09-13 DIAGNOSIS — Z0289 Encounter for other administrative examinations: Secondary | ICD-10-CM

## 2014-09-15 ENCOUNTER — Encounter: Payer: Self-pay | Admitting: Family

## 2014-09-15 NOTE — Telephone Encounter (Signed)
Pt was no show 09/13/14 8:00am, follow up appt 6 months, unable to leave msg for pt, mailing letter to reschedule, charge for no show?

## 2014-09-15 NOTE — Telephone Encounter (Signed)
Yes please

## 2015-01-31 MED FILL — SYNTHROID 137 MCG TABLET: 137 | 80 days supply | Qty: 90 | Fill #0

## 2015-04-12 DIAGNOSIS — E890 Postprocedural hypothyroidism: Secondary | ICD-10-CM | POA: Diagnosis not present

## 2015-04-12 DIAGNOSIS — C73 Malignant neoplasm of thyroid gland: Secondary | ICD-10-CM | POA: Diagnosis not present

## 2015-05-01 MED FILL — SYNTHROID 137 MCG TABLET: 137 | 90 days supply | Qty: 90 | Fill #0

## 2015-07-30 MED FILL — SYNTHROID 137 MCG TABLET: 137 | 90 days supply | Qty: 90 | Fill #1

## 2015-10-26 MED FILL — SYNTHROID 137 MCG TABLET: 137 | 90 days supply | Qty: 90 | Fill #2

## 2016-01-08 MED FILL — SYNTHROID 137 MCG TABLET: 137 | 90 days supply | Qty: 90 | Fill #3

## 2016-04-14 DIAGNOSIS — C73 Malignant neoplasm of thyroid gland: Secondary | ICD-10-CM | POA: Diagnosis not present

## 2016-04-14 DIAGNOSIS — E890 Postprocedural hypothyroidism: Secondary | ICD-10-CM | POA: Diagnosis not present

## 2016-04-17 MED FILL — SYNTHROID 125 MCG TABLET: 125 | 90 days supply | Qty: 90 | Fill #0

## 2016-06-20 DIAGNOSIS — E890 Postprocedural hypothyroidism: Secondary | ICD-10-CM | POA: Diagnosis not present

## 2016-06-30 MED FILL — SYNTHROID 125 MCG TABLET: 125 | 90 days supply | Qty: 90 | Fill #0

## 2016-10-02 MED FILL — SYNTHROID 125 MCG TABLET: 125 | 90 days supply | Qty: 90 | Fill #1

## 2017-01-07 MED FILL — SYNTHROID 125 MCG TABLET: 125 | 90 days supply | Qty: 90 | Fill #2

## 2017-04-01 MED FILL — SYNTHROID 125 MCG TABLET: 125 | 90 days supply | Qty: 90 | Fill #3

## 2017-04-15 ENCOUNTER — Encounter: Payer: Self-pay | Admitting: Family Medicine

## 2017-04-15 ENCOUNTER — Ambulatory Visit: Payer: 59 | Admitting: Family Medicine

## 2017-04-15 VITALS — BP 122/84 | HR 72 | Temp 97.9°F | Ht 65.5 in | Wt 198.4 lb

## 2017-04-15 DIAGNOSIS — J4520 Mild intermittent asthma, uncomplicated: Secondary | ICD-10-CM | POA: Diagnosis not present

## 2017-04-15 DIAGNOSIS — E890 Postprocedural hypothyroidism: Secondary | ICD-10-CM | POA: Diagnosis not present

## 2017-04-15 DIAGNOSIS — Z23 Encounter for immunization: Secondary | ICD-10-CM

## 2017-04-15 DIAGNOSIS — C73 Malignant neoplasm of thyroid gland: Secondary | ICD-10-CM | POA: Diagnosis not present

## 2017-04-15 MED ORDER — ALBUTEROL SULFATE HFA 108 (90 BASE) MCG/ACT IN AERS: 2.0000 | INHALATION_SPRAY | RESPIRATORY_TRACT | 2 refills | Status: DC | PRN

## 2017-04-15 MED FILL — VENTOLIN HFA 90 MCG INHALER: 108 (90 BAS | 17 days supply | Qty: 18 | Fill #0

## 2017-04-15 NOTE — Patient Instructions (Signed)
Let us know if you need anything.  Healthy Eating Plan Many factors influence your heart health, including eating and exercise habits. Heart (coronary) risk increases with abnormal blood fat (lipid) levels. Heart-healthy meal planning includes limiting unhealthy fats, increasing healthy fats, and making other small dietary changes. This includes maintaining a healthy body weight to help keep lipid levels within a normal range.  WHAT IS MY PLAN?  Your health care provider recommends that you:  Drink a glass of water before meals to help with satiety.  Eat slowly.  An alternative to the water is to add Metamucil. This will help with satiety as well. It does contain calories, unlike water.  WHAT TYPES OF FAT SHOULD I CHOOSE?  Choose healthy fats more often. Choose monounsaturated and polyunsaturated fats, such as olive oil and canola oil, flaxseeds, walnuts, almonds, and seeds.  Eat more omega-3 fats. Good choices include salmon, mackerel, sardines, tuna, flaxseed oil, and ground flaxseeds. Aim to eat fish at least two times each week.  Avoid foods with partially hydrogenated oils in them. These contain trans fats. Examples of foods that contain trans fats are stick margarine, some tub margarines, cookies, crackers, and other baked goods. If you are going to avoid a fat, this is the one to avoid!  WHAT GENERAL GUIDELINES DO I NEED TO FOLLOW?  Check food labels carefully to identify foods with trans fats. Avoid these types of options when possible.  Fill one half of your plate with vegetables and green salads. Eat 4-5 servings of vegetables per day. A serving of vegetables equals 1 cup of raw leafy vegetables,  cup of raw or cooked cut-up vegetables, or  cup of vegetable juice.  Fill one fourth of your plate with whole grains. Look for the word "whole" as the first word in the ingredient list.  Fill one fourth of your plate with lean protein foods.  Eat 4-5 servings of fruit per day. A  serving of fruit equals one medium whole fruit,  cup of dried fruit,  cup of fresh, frozen, or canned fruit. Try to avoid fruits in cups/syrups as the sugar content can be high.  Eat more foods that contain soluble fiber. Examples of foods that contain this type of fiber are apples, broccoli, carrots, beans, peas, and barley. Aim to get 20-30 g of fiber per day.  Eat more home-cooked food and less restaurant, buffet, and fast food.  Limit or avoid alcohol.  Limit foods that are high in starch and sugar.  Avoid fried foods when able.  Cook foods by using methods other than frying. Baking, boiling, grilling, and broiling are all great options. Other fat-reducing suggestions include: ? Removing the skin from poultry. ? Removing all visible fats from meats. ? Skimming the fat off of stews, soups, and gravies before serving them. ? Steaming vegetables in water or broth.  Lose weight if you are overweight. Losing just 5-10% of your initial body weight can help your overall health and prevent diseases such as diabetes and heart disease.  Increase your consumption of nuts, legumes, and seeds to 4-5 servings per week. One serving of dried beans or legumes equals  cup after being cooked, one serving of nuts equals 1 ounces, and one serving of seeds equals  ounce or 1 tablespoon.  WHAT ARE GOOD FOODS CAN I EAT? Grains Grainy breads (try to find bread that is 3 g of fiber per slice or greater), oatmeal, light popcorn. Whole-grain cereals. Rice and pasta, including brown rice  and those that are made with whole wheat. Edamame pasta is a great alternative to grain pasta. It has a higher protein content. Try to avoid significant consumption of white bread, sugary cereals, or pastries/baked goods.  Vegetables All vegetables. Cooked white potatoes do not count as vegetables.  Fruits All fruits, but limit pineapple and bananas as these fruits have a higher sugar content.  Meats and Other Protein  Sources Lean, well-trimmed beef, veal, pork, and lamb. Chicken and [place] without skin. All fish and shellfish. Wild duck, rabbit, pheasant, and venison. Egg whites or low-cholesterol egg substitutes. Dried beans, peas, lentils, and tofu.Seeds and most nuts.  Dairy Low-fat or nonfat cheeses, including ricotta, string, and mozzarella. Skim or 1% milk that is liquid, powdered, or evaporated. Buttermilk that is made with low-fat milk. Nonfat or low-fat yogurt. Soy/Almond milk are good alternatives if you cannot handle dairy.  Beverages Water is the best for you. Sports drinks with less sugar are more desirable unless you are a highly active athlete.  Sweets and Desserts Sherbets and fruit ices. Honey, jam, marmalade, jelly, and syrups. Dark chocolate.  Eat all sweets and desserts in moderation.  Fats and Oils Nonhydrogenated (trans-free) margarines. Vegetable oils, including soybean, sesame, sunflower, olive, peanut, safflower, corn, canola, and cottonseed. Salad dressings or mayonnaise that are made with a vegetable oil. Limit added fats and oils that you use for cooking, baking, salads, and as spreads.  Other Cocoa powder. Coffee and tea. Most condiments.  The items listed above may not be a complete list of recommended foods or beverages. Contact your dietitian for more options.

## 2017-04-15 NOTE — Progress Notes (Signed)
Pre visit review using our clinic review tool, if applicable. No additional management support is needed unless otherwise documented below in the visit note. 

## 2017-04-15 NOTE — Addendum Note (Signed)
Addended by: Sharon Seller B on: 04/15/2017 01:52 PM   Modules accepted: Orders

## 2017-04-15 NOTE — Progress Notes (Signed)
Chief Complaint  Patient presents with  . Establish Care    transfer       New Patient Visit SUBJECTIVE: HPI: April Stone is an 57 y.o.female who is being seen for establishing care.  Asthma Hx of asthma, on albuterol only, doing well, rarely uses unless she exercises or has a URI. Not on maintenance. No recent hospitalizations or PO steroid use. She has allergies also, intermittently uses a nasal spray.  No Known Allergies  Past Medical History:  Diagnosis Date  . Anemia   . Asthma   . Thyroid cancer [facility])    Past Surgical History:  Procedure Laterality Date  . APPENDECTOMY    . BUNIONECTOMY    . THYROIDECTOMY    . TONSILLECTOMY      Family History  Problem Relation Age of Onset  . Thyroid disease Mother   . Hypertension Father   . Atrial fibrillation Maternal Grandmother      Current Outpatient Medications:  .  albuterol (PROVENTIL HFA;VENTOLIN HFA) 108 (90 Base) MCG/ACT inhaler, Inhale 2 puffs into the lungs every 4 (four) hours as needed for wheezing or shortness of breath. 2puffs before exercise to prevent wheezing, Disp: 18 g, Rfl: 2 .  Cholecalciferol (VITAMIN D) 1000 UNITS capsule, Take 2,000 Units by mouth daily., Disp: , Rfl:  .  fluticasone (FLONASE) 50 MCG/ACT nasal spray, 1 or 2 sprays each nostril twice a day, Disp: 16 g, Rfl: 2 .  ibuprofen (ADVIL,MOTRIN) 200 MG tablet, Take 200 mg by mouth every 6 (six) hours as needed (pain.)., Disp: , Rfl:  .  levothyroxine (SYNTHROID, LEVOTHROID) 125 MCG tablet, Take 1 tablet (125 mcg total) by mouth daily., Disp: 90 tablet, Rfl: 3  Patient's last menstrual period was 08/01/2013.  ROS Cardiovascular: Denies chest pain  Respiratory: Denies dyspnea   OBJECTIVE: BP 122/84 (BP Location: Left Arm, Patient Position: Sitting, Cuff Size: Normal)   Pulse 72   Temp 97.9 F (36.6 C) (Oral)   Ht 5' 5.5" (1.664 m)   Wt 198 lb 6 oz (90 kg)   LMP 08/01/2013   SpO2 97%   BMI 32.51 kg/m   Constitutional: -  VS  reviewed -  Well developed, well nourished, appears stated age -  No apparent distress  Psychiatric: -  Oriented to person, place, and time -  Memory intact -  Affect and mood normal -  Fluent conversation, good eye contact -  Judgment and insight age appropriate  Eye: -  Conjunctivae clear, no discharge -  Pupils symmetric, round, reactive to light  ENMT: -  MMM    Pharynx moist, no exudate, no erythema  Neck: -  No gross swelling, no palpable masses -  Thyroid midline, not enlarged, mobile, no palpable masses  Cardiovascular: -  RRR -  No LE edema  Respiratory: -  Normal respiratory effort, no accessory muscle use, no retraction -  Breath sounds equal, no wheezes, no ronchi, no crackles   ASSESSMENT/PLAN: Mild intermittent asthma without complication - Plan: albuterol (PROVENTIL HFA;VENTOLIN HFA) 108 (90 Base) MCG/ACT inhaler  Refill albuterol. PCV23. Doing well, not requiring maintenance inhalers. Patient should return at earliest convenience for CPE. The patient voiced understanding and agreement to the plan.   [place], DO 04/15/17  1:46 PM

## 2017-04-22 ENCOUNTER — Encounter: Payer: Self-pay | Admitting: Family Medicine

## 2017-04-22 ENCOUNTER — Ambulatory Visit (INDEPENDENT_AMBULATORY_CARE_PROVIDER_SITE_OTHER): Payer: 59 | Admitting: Family Medicine

## 2017-04-22 VITALS — BP 122/82 | HR 72 | Temp 97.9°F | Ht 65.5 in | Wt 194.5 lb

## 2017-04-22 DIAGNOSIS — Z0000 Encounter for general adult medical examination without abnormal findings: Secondary | ICD-10-CM

## 2017-04-22 DIAGNOSIS — Z1211 Encounter for screening for malignant neoplasm of colon: Secondary | ICD-10-CM

## 2017-04-22 DIAGNOSIS — Z114 Encounter for screening for human immunodeficiency virus [HIV]: Secondary | ICD-10-CM | POA: Diagnosis not present

## 2017-04-22 DIAGNOSIS — Z1239 Encounter for other screening for malignant neoplasm of breast: Secondary | ICD-10-CM

## 2017-04-22 DIAGNOSIS — Z1231 Encounter for screening mammogram for malignant neoplasm of breast: Secondary | ICD-10-CM | POA: Diagnosis not present

## 2017-04-22 DIAGNOSIS — Z1159 Encounter for screening for other viral diseases: Secondary | ICD-10-CM | POA: Diagnosis not present

## 2017-04-22 NOTE — Patient Instructions (Signed)
Call Center for [facility] at [facility] at [phone] for an appointment.  They are located at [address], [address], [place], [place], [address] (right across the hall from our office).   If you do not hear anything about your referral in the next 1-2 weeks, call our office and ask for an update.  Let us know if you need anything.

## 2017-04-22 NOTE — Progress Notes (Signed)
Chief Complaint  Patient presents with  . Annual Exam     Well Woman April Stone is here for a complete physical.   Her last physical was >1 year ago.  Current diet: in general, a "healthy" diet. Current exercise: Riding bike and walking. Weight is steadily decreasing and she denies daytime fatigue. Patient's last menstrual period was 08/01/2013. Seatbelt? Yes  Health Maintenance Pap/HPV- No Mammogram- No Tetanus- Yes Hep C screening- No HIV screening- No+ Colonoscopy- No  Past Medical History:  Diagnosis Date  . Anemia   . Asthma   . Thyroid cancer [facility])      Past Surgical History:  Procedure Laterality Date  . APPENDECTOMY    . BUNIONECTOMY    . THYROIDECTOMY    . TONSILLECTOMY      Medications  Current Outpatient Medications on File Prior to Visit  Medication Sig Dispense Refill  . albuterol (PROVENTIL HFA;VENTOLIN HFA) 108 (90 Base) MCG/ACT inhaler Inhale 2 puffs into the lungs every 4 (four) hours as needed for wheezing or shortness of breath. 2puffs before exercise to prevent wheezing 18 g 2  . Cholecalciferol (VITAMIN D) 1000 UNITS capsule Take 2,000 Units by mouth daily.    . fluticasone (FLONASE) 50 MCG/ACT nasal spray 1 or 2 sprays each nostril twice a day 16 g 2  . ibuprofen (ADVIL,MOTRIN) 200 MG tablet Take 200 mg by mouth every 6 (six) hours as needed (pain.).    [name] levothyroxine (SYNTHROID, LEVOTHROID) 125 MCG tablet Take 1 tablet (125 mcg total) by mouth daily. 90 tablet 3    Allergies No Known Allergies  Review of Systems: Constitutional:  no fevers Eye:  no recent significant change in vision Ear/Nose/Mouth/Throat:  Ears:  no tinnitus or vertigo and no recent change in hearing, Nose/Mouth/Throat:  no complaints of nasal congestion, no sore throat Cardiovascular: no chest pain, no palpitations Respiratory:  no cough and no shortness of breath Gastrointestinal:  no abdominal pain, no change in bowel habits, no significant change in appetite,  no nausea, vomiting, diarrhea, or constipation and no black or bloody stool GU:  Female: negative for dysuria, frequency, and incontinence; no abnormal bleeding, pelvic pain, or discharge Musculoskeletal/Extremities: +aches and pains of hands; otherwise no pain of the joints Integumentary (Skin/Breast):  no abnormal skin lesions reported Neurologic:  no headaches Endocrine:  denies fatigue Hematologic/Lymphatic:  no unexpected weight changes  Exam BP 122/82 (BP Location: Left Arm, Patient Position: Sitting, Cuff Size: Normal)   Pulse 72   Temp 97.9 F (36.6 C) (Oral)   Ht 5' 5.5" (1.664 m)   Wt 194 lb 8 oz (88.2 kg)   LMP 08/01/2013   SpO2 97%   BMI 31.87 kg/m  General:  well developed, well nourished, in no apparent distress Skin:  no significant moles, warts, or growths Head:  no masses, lesions, or tenderness Eyes:  pupils equal and round, sclera anicteric without injection Ears:  canals without lesions, TMs shiny without retraction, no obvious effusion, no erythema Nose:  nares patent, septum midline, mucosa normal, and no drainage or sinus tenderness Throat/Pharynx:  lips and gingiva without lesion; tongue and uvula midline; non-inflamed pharynx; no exudates or postnasal drainage Neck: neck supple without adenopathy, thyromegaly, or masses Breasts:  Not done Thorax:  nontender Lungs:  clear to auscultation, breath sounds equal bilaterally, no respiratory distress Cardio:  regular rate and rhythm without murmurs, heart sounds without clicks or rubs, point of maximal impulse normal; no lifts, heaves, or thrills Abdomen:  abdomen soft,  nontender; bowel sounds normal; no masses or organomegaly Genital: Defer to GYN Musculoskeletal:  symmetrical muscle groups noted without atrophy or deformity Extremities:  no clubbing, cyanosis, or edema, no deformities, no skin discoloration Neuro:  gait normal; deep tendon reflexes normal and symmetric Psych: well oriented with normal range of  affect and appropriate judgment/insight  Assessment and Plan  Well adult exam - Plan: CBC, Comprehensive metabolic panel, Lipid panel  Screening for HIV (human immunodeficiency virus) - Plan: HIV antibody  Encounter for hepatitis C screening test for low risk patient - Plan: Hepatitis C antibody  Screen for colon cancer - Plan: Ambulatory referral to Gastroenterology  Screening for malignant neoplasm of breast - Plan: MM DIGITAL SCREENING BILATERAL   Well 57 y.o. female. Counseled on diet and exercise.  Other orders as above. GYN info given. Follow up in 1 yr or prn. The patient voiced understanding and agreement to the plan.  [place], DO 04/22/17 3:27 PM

## 2017-04-22 NOTE — Progress Notes (Signed)
Pre visit review using our clinic review tool, if applicable. No additional management support is needed unless otherwise documented below in the visit note. 

## 2017-04-23 LAB — COMPREHENSIVE METABOLIC PANEL
ALT: 28 U/L (ref 0–35)
AST: 21 U/L (ref 0–37)
Albumin: 4.4 g/dL (ref 3.5–5.2)
Alkaline Phosphatase: 66 U/L (ref 39–117)
BUN: 19 mg/dL (ref 6–23)
CO2: 29 mEq/L (ref 19–32)
Calcium: 10.1 mg/dL (ref 8.4–10.5)
Chloride: 104 mEq/L (ref 96–112)
Creatinine, Ser: 0.71 mg/dL (ref 0.40–1.20)
GFR: 90.38 mL/min (ref >60.00)
GLUCOSE: 101 mg/dL — AB (ref 70–99)
Potassium: 4.3 mEq/L (ref 3.5–5.1)
SODIUM: 143 meq/L (ref 135–145)
Total Bilirubin: 0.7 mg/dL (ref 0.2–1.2)
Total Protein: 7.6 g/dL (ref 6.0–8.3)

## 2017-04-23 LAB — CBC
HEMATOCRIT: 45.2 % (ref 36.0–46.0)
Hemoglobin: 15.1 g/dL — ABNORMAL HIGH (ref 12.0–15.0)
MCHC: 33.4 g/dL (ref 30.0–36.0)
MCV: 93.1 fl (ref 78.0–100.0)
Platelets: 150 10*3/uL (ref 150.0–400.0)
RBC: 4.86 Mil/uL (ref 3.87–5.11)
RDW: 13.6 % (ref 11.5–15.5)
WBC: 4.8 10*3/uL (ref 4.0–10.5)

## 2017-04-23 LAB — LIPID PANEL
Cholesterol: 201 mg/dL — ABNORMAL HIGH (ref 0–200)
HDL: 87.8 mg/dL (ref >39.00)
LDL CALC: 101 mg/dL — AB (ref 0–99)
NonHDL: 112.72
Total CHOL/HDL Ratio: 2
Triglycerides: 60 mg/dL (ref 0.0–149.0)
VLDL: 12 mg/dL (ref 0.0–40.0)

## 2017-04-23 LAB — HEPATITIS C ANTIBODY
HEP C AB: NONREACTIVE
SIGNAL TO CUT-OFF: 0.46 (ref <1.00)

## 2017-04-23 LAB — HIV ANTIBODY (ROUTINE TESTING W REFLEX): HIV 1&2 Ab, 4th Generation: NONREACTIVE

## 2017-04-24 ENCOUNTER — Other Ambulatory Visit (INDEPENDENT_AMBULATORY_CARE_PROVIDER_SITE_OTHER): Payer: 59

## 2017-04-24 DIAGNOSIS — R739 Hyperglycemia, unspecified: Secondary | ICD-10-CM | POA: Diagnosis not present

## 2017-04-24 LAB — HEMOGLOBIN A1C: Hgb A1c MFr Bld: 5.5 % (ref 4.6–6.5)

## 2017-05-13 ENCOUNTER — Other Ambulatory Visit: Payer: Self-pay | Admitting: Family Medicine

## 2017-05-13 ENCOUNTER — Encounter (HOSPITAL_BASED_OUTPATIENT_CLINIC_OR_DEPARTMENT_OTHER): Payer: Self-pay

## 2017-05-13 ENCOUNTER — Ambulatory Visit (HOSPITAL_BASED_OUTPATIENT_CLINIC_OR_DEPARTMENT_OTHER)
Admission: RE | Admit: 2017-05-13 | Discharge: 2017-05-13 | Disposition: A | Discharge status: Home / Self Care | Payer: 59 | Source: Ambulatory Visit | Attending: Family Medicine | Admitting: Family Medicine

## 2017-05-13 DIAGNOSIS — Z1231 Encounter for screening mammogram for malignant neoplasm of breast: Secondary | ICD-10-CM | POA: Diagnosis not present

## 2017-05-13 DIAGNOSIS — Z1239 Encounter for other screening for malignant neoplasm of breast: Secondary | ICD-10-CM

## 2017-05-25 ENCOUNTER — Encounter: Payer: Self-pay | Admitting: Family Medicine

## 2017-06-12 MED FILL — SYNTHROID 125 MCG TABLET: 125 | 90 days supply | Qty: 90 | Fill #0

## 2017-08-29 DIAGNOSIS — H5203 Hypermetropia, bilateral: Secondary | ICD-10-CM | POA: Diagnosis not present

## 2017-09-15 MED FILL — SYNTHROID 125 MCG TABLET: 125 | 90 days supply | Qty: 90 | Fill #1

## 2017-10-26 ENCOUNTER — Ambulatory Visit: Payer: 59 | Admitting: Family Medicine

## 2017-10-26 ENCOUNTER — Encounter: Payer: Self-pay | Admitting: Family Medicine

## 2017-10-26 VITALS — BP 142/82 | HR 77 | Ht 65.0 in | Wt 189.0 lb

## 2017-10-26 DIAGNOSIS — G8929 Other chronic pain: Secondary | ICD-10-CM

## 2017-10-26 DIAGNOSIS — M25562 Pain in left knee: Secondary | ICD-10-CM | POA: Diagnosis not present

## 2017-10-26 MED ORDER — DICLOFENAC SODIUM 1 % TD GEL: 4.0000 g | Freq: Four times a day (QID) | TRANSDERMAL | 1 refills | Status: DC

## 2017-10-26 MED FILL — DICLOFENAC SODIUM 1% GEL: 1 | 30 days supply | Qty: 500 | Fill #0

## 2017-10-26 NOTE — Patient Instructions (Signed)
Your pain is due to arthritis, possible superimposed patellofemoral syndrome. These are the different medications you can take for this: Tylenol 500mg  1-2 tabs three times a day for pain. Voltaren gel up to 4 times a day topically. Some supplements that may help for arthritis: Boswellia extract, curcumin, pycnogenol Cortisone injections are an option. It's important that you continue to stay active. Straight leg raises, straight leg raises with foot turned outwards, hip side raises, and knee extensions 3 sets of 10 once a day (add ankle weight if these become too easy). Consider physical therapy to strengthen muscles around the joint that hurts to take pressure off of the joint itself. Shoe inserts with good arch support may be helpful. Heat or ice 15 minutes at a time 3-4 times a day as needed to help with pain. Water aerobics and cycling with low resistance are the best two types of exercise for arthritis though any exercise is ok as long as it doesn't worsen the pain. Knee brace when up and walking around. Follow up with me in 6 weeks.

## 2017-10-26 NOTE — Progress Notes (Signed)
PCP: Shelda Pal, DO  Subjective:   HPI: Patient is a 57 y.o. female here for left knee pain.  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 She reports for about 2 months now she's had anterior left knee pain. Describes this as a tightness at 2/10 level but can get up to 10/10 level. Worse at night, with prolonged standing and immobility. Tried icing, motrin which help some. No skin changes, numbness. Feels like it catches behind patella. No giving out. No right knee pain.  Past Medical History:  Diagnosis Date  . Anemia   . Asthma   . Thyroid cancer [facility])     Current Outpatient Medications on File Prior to Visit  Medication Sig Dispense Refill  . albuterol (PROVENTIL HFA;VENTOLIN HFA) 108 (90 Base) MCG/ACT inhaler Inhale 2 puffs into the lungs every 4 (four) hours as needed for wheezing or shortness of breath. 2puffs before exercise to prevent wheezing 18 g 2  . Cholecalciferol (VITAMIN D) 1000 UNITS capsule Take 2,000 Units by mouth daily.    . fluticasone (FLONASE) 50 MCG/ACT nasal spray 1 or 2 sprays each nostril twice a day 16 g 2  . levothyroxine (SYNTHROID, LEVOTHROID) 125 MCG tablet Take 1 tablet (125 mcg total) by mouth daily. 90 tablet 3   No current facility-administered medications on file prior to visit.     Past Surgical History:  Procedure Laterality Date  . APPENDECTOMY    . BUNIONECTOMY    . THYROIDECTOMY    . TONSILLECTOMY      No Known Allergies  Social History   Socioeconomic History  . Marital status: Married    Spouse name: Not on file  . Number of children: Not on file  . Years of education: Not on file  . Highest education level: Not on file  Occupational History  . Not on file  Social Needs  . Financial resource strain: Not on file  . Food insecurity:    Worry: Not on file    Inability: Not on file  . Transportation needs:    Medical: Not on file    Non-medical: Not on file  Tobacco Use  . Smoking status: Never Smoker   . Smokeless tobacco: Never Used  Substance and Sexual Activity  . Alcohol use: Yes    Alcohol/week: 0.0 standard drinks    Comment: rarely  . Drug use: No  . Sexual activity: Not on file  Lifestyle  . Physical activity:    Days per week: Not on file    Minutes per session: Not on file  . Stress: Not on file  Relationships  . Social connections:    Talks on phone: Not on file    Gets together: Not on file    Attends religious service: Not on file    Active member of club or organization: Not on file    Attends meetings of clubs or organizations: Not on file    Relationship status: Not on file  . Intimate partner violence:    Fear of current or ex partner: Not on file    Emotionally abused: Not on file    Physically abused: Not on file    Forced sexual activity: Not on file  Other Topics Concern  . Not on file  Social History Narrative  . Not on file    Family History  Problem Relation Age of Onset  . Thyroid disease Mother   . Hypertension Father   . Atrial fibrillation Maternal Grandmother  BP (!) 142/82   Pulse 77   Ht 5\' 5"  (1.651 m)   Wt 189 lb (85.7 kg)   LMP 08/01/2013   BMI 31.45 kg/m   Review of Systems: See HPI above.     Objective:  Physical Exam:  Gen: NAD, comfortable in exam room  Left knee: No gross deformity, ecchymoses, effusion. No TTP currently. FROM with 5/5 strength flexion and extension. Negative ant/post drawers. Negative valgus/varus testing. Negative lachmans. Negative mcmurrays, apleys, patellar apprehension. NV intact distally.  Right knee: No deformity. FROM with 5/5 strength. No tenderness to palpation. NVI distally.   Assessment & Plan:  1. Left knee pain - independently reviewed radiographs from  04/2013 and noted mild patellofemoral arthropathy.  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  Knee brace for support.   Tylenol, voltaren gel as needed.  Reviewed supplements  that may help.  Home exercises reviewed.  Consider injection if struggling.  F/u in 6 weeks.

## 2017-10-29 ENCOUNTER — Ambulatory Visit: Payer: 59 | Admitting: Family Medicine

## 2017-11-24 MED FILL — VENTOLIN HFA 90 MCG INHALER: 108 (90 BAS | 17 days supply | Qty: 18 | Fill #1

## 2017-12-07 ENCOUNTER — Ambulatory Visit: Payer: 59 | Admitting: Family Medicine

## 2017-12-14 MED FILL — SYNTHROID 125 MCG TABLET: 125 | 90 days supply | Qty: 90 | Fill #2

## 2018-03-16 MED FILL — SYNTHROID 125 MCG TABLET: 125 | 90 days supply | Qty: 90 | Fill #3

## 2018-04-16 DIAGNOSIS — E890 Postprocedural hypothyroidism: Secondary | ICD-10-CM | POA: Diagnosis not present

## 2018-04-16 DIAGNOSIS — C73 Malignant neoplasm of thyroid gland: Secondary | ICD-10-CM | POA: Diagnosis not present

## 2018-04-26 ENCOUNTER — Encounter: Payer: 59 | Admitting: Family Medicine

## 2018-06-15 MED FILL — SYNTHROID 125 MCG TABLET: 125 | 90 days supply | Qty: 90 | Fill #0

## 2018-06-28 ENCOUNTER — Encounter: Payer: 59 | Admitting: Family Medicine

## 2018-07-16 ENCOUNTER — Encounter: Payer: Self-pay | Admitting: Family Medicine

## 2018-07-16 ENCOUNTER — Ambulatory Visit (INDEPENDENT_AMBULATORY_CARE_PROVIDER_SITE_OTHER): Payer: 59 | Admitting: Family Medicine

## 2018-07-16 ENCOUNTER — Other Ambulatory Visit: Payer: Self-pay

## 2018-07-16 VITALS — BP 118/80 | HR 71 | Temp 97.8°F | Ht 65.0 in | Wt 203.0 lb

## 2018-07-16 DIAGNOSIS — Z23 Encounter for immunization: Secondary | ICD-10-CM

## 2018-07-16 DIAGNOSIS — J4520 Mild intermittent asthma, uncomplicated: Secondary | ICD-10-CM | POA: Diagnosis not present

## 2018-07-16 DIAGNOSIS — Z0000 Encounter for general adult medical examination without abnormal findings: Secondary | ICD-10-CM | POA: Diagnosis not present

## 2018-07-16 DIAGNOSIS — Z1239 Encounter for other screening for malignant neoplasm of breast: Secondary | ICD-10-CM

## 2018-07-16 LAB — VITAMIN D 25 HYDROXY (VIT D DEFICIENCY, FRACTURES): VITD: 67.76 ng/mL (ref 30.00–100.00)

## 2018-07-16 LAB — CBC
HCT: 45 % (ref 36.0–46.0)
Hemoglobin: 14.9 g/dL (ref 12.0–15.0)
MCHC: 33.2 g/dL (ref 30.0–36.0)
MCV: 92.9 fl (ref 78.0–100.0)
Platelets: 161 10*3/uL (ref 150.0–400.0)
RBC: 4.84 Mil/uL (ref 3.87–5.11)
RDW: 13.4 % (ref 11.5–15.5)
WBC: 4.1 10*3/uL (ref 4.0–10.5)

## 2018-07-16 LAB — COMPREHENSIVE METABOLIC PANEL
ALT: 26 U/L (ref 0–35)
AST: 18 U/L (ref 0–37)
Albumin: 4.3 g/dL (ref 3.5–5.2)
Alkaline Phosphatase: 62 U/L (ref 39–117)
BUN: 22 mg/dL (ref 6–23)
CO2: 28 mEq/L (ref 19–32)
Calcium: 9.2 mg/dL (ref 8.4–10.5)
Chloride: 106 mEq/L (ref 96–112)
Creatinine, Ser: 0.65 mg/dL (ref 0.40–1.20)
GFR: 93.75 mL/min (ref >60.00)
Glucose, Bld: 96 mg/dL (ref 70–99)
Potassium: 4.1 mEq/L (ref 3.5–5.1)
Sodium: 140 mEq/L (ref 135–145)
Total Bilirubin: 0.6 mg/dL (ref 0.2–1.2)
Total Protein: 6.4 g/dL (ref 6.0–8.3)

## 2018-07-16 LAB — LIPID PANEL
Cholesterol: 186 mg/dL (ref 0–200)
HDL: 78.1 mg/dL (ref >39.00)
LDL Cholesterol: 98 mg/dL (ref 0–99)
NonHDL: 108.13
Total CHOL/HDL Ratio: 2
Triglycerides: 49 mg/dL (ref 0.0–149.0)
VLDL: 9.8 mg/dL (ref 0.0–40.0)

## 2018-07-16 MED ORDER — ALBUTEROL SULFATE HFA 108 (90 BASE) MCG/ACT IN AERS: 2.0000 | INHALATION_SPRAY | RESPIRATORY_TRACT | 2 refills | Status: DC | PRN

## 2018-07-16 NOTE — Addendum Note (Signed)
Addended by: Hinton Dyer on: 07/16/2018 11:03 AM   Modules accepted: Orders

## 2018-07-16 NOTE — Patient Instructions (Addendum)
Give [place] 2-3 business days to get the results of your labs back.   Keep the diet clean and stay active.  Make sure you schedule your colonoscopy and pap smear!   The new Shingrix vaccine (for shingles) is a 2 shot series. It can make people feel low energy, achy and almost like they have the flu for 48 hours after injection. Please plan accordingly when deciding on when to get this shot. Call our office for a nurse visit appointment to get this. The second shot of the series is less severe regarding the side effects, but it still lasts 48 hours.   Let us know if you need anything.

## 2018-07-16 NOTE — Progress Notes (Signed)
Chief Complaint  Patient presents with  . Annual Exam     Well Woman April Stone is here for a complete physical.   Her last physical was >1 year ago.  Current diet: in general, a "healthy" diet. Current exercise: active in yard, walking, wt resistance. Weight is stable and she denies daytime fatigue. Patient's last menstrual period was 08/01/2013. Seatbelt? Yes  Health Maintenance Pap/HPV- Due Mammogram- No  Colonoscopy- Due Tetanus- Yes Hep C screening- Yes HIV screening- Yes   Past Medical History:  Diagnosis Date  . Anemia   . Asthma   . Thyroid cancer [facility])      Past Surgical History:  Procedure Laterality Date  . APPENDECTOMY    . BUNIONECTOMY    . THYROIDECTOMY    . TONSILLECTOMY      Medications  Current Outpatient Medications on File Prior to Visit  Medication Sig Dispense Refill  . albuterol (PROVENTIL HFA;VENTOLIN HFA) 108 (90 Base) MCG/ACT inhaler Inhale 2 puffs into the lungs every 4 (four) hours as needed for wheezing or shortness of breath. 2puffs before exercise to prevent wheezing 18 g 2  . Cholecalciferol (VITAMIN D) 1000 UNITS capsule Take 2,000 Units by mouth daily.    . diclofenac sodium (VOLTAREN) 1 % GEL Apply 4 g topically 4 (four) times daily. 5 Tube 1  . fluticasone (FLONASE) 50 MCG/ACT nasal spray 1 or 2 sprays each nostril twice a day 16 g 2  . levothyroxine (SYNTHROID, LEVOTHROID) 125 MCG tablet Take 1 tablet (125 mcg total) by mouth daily. 90 tablet 3   Allergies No Known Allergies  Review of Systems: Constitutional:  no unexpected weight changes Eye:  no recent significant change in vision Ear/Nose/Mouth/Throat:  Ears:  no tinnitus or vertigo and no recent change in hearing Nose/Mouth/Throat:  no complaints of nasal congestion, no sore throat Cardiovascular: no chest pain Respiratory:  no cough and no shortness of breath Gastrointestinal:  no abdominal pain, no change in bowel habits GU:  Female: negative for dysuria or  pelvic pain Musculoskeletal/Extremities:  no pain of the joints Integumentary (Skin/Breast):  no abnormal skin lesions reported Neurologic:  no headaches Endocrine:  denies fatigue Hematologic/Lymphatic:  No areas of easy bleeding  Exam BP 118/80 (BP Location: Left Arm, Patient Position: Sitting, Cuff Size: Large)   Pulse 71   Temp 97.8 F (36.6 C) (Oral)   Ht 5\' 5"  (1.651 m)   Wt 203 lb (92.1 kg)   LMP 08/01/2013   SpO2 97%   BMI 33.78 kg/m  General:  well developed, well nourished, in no apparent distress Skin:  no significant moles, warts, or growths Head:  no masses, lesions, or tenderness Eyes:  pupils equal and round, sclera anicteric without injection Ears:  canals without lesions, TMs shiny without retraction, no obvious effusion, no erythema Nose:  nares patent, septum midline, mucosa normal, and no drainage or sinus tenderness Throat/Pharynx:  lips and gingiva without lesion; tongue and uvula midline; non-inflamed pharynx; no exudates or postnasal drainage Neck: neck supple without adenopathy, thyromegaly, or masses Lungs:  clear to auscultation, breath sounds equal bilaterally, no respiratory distress Cardio:  regular rate and rhythm, no bruits, no LE edema Abdomen:  abdomen soft, nontender; bowel sounds normal; no masses or organomegaly Genital: Defer to GYN Musculoskeletal:  symmetrical muscle groups noted without atrophy or deformity Extremities:  no clubbing, cyanosis, or edema, no deformities, no skin discoloration Neuro:  gait normal; deep tendon reflexes normal and symmetric Psych: well oriented with normal range  of affect and appropriate judgment/insight  Assessment and Plan  Well adult exam - Plan: CBC, Comprehensive metabolic panel, Lipid panel, Vitamin D (25 hydroxy)  Screening for breast cancer - Plan: MM DIGITAL SCREENING BILATERAL  Mild intermittent asthma without complication - Plan: albuterol (VENTOLIN HFA) 108 (90 Base) MCG/ACT inhaler, PCV23 today    Well 58 y.o. female. Counseled on diet and exercise. Other orders as above. Follow up in 1 yr or prn. The patient voiced understanding and agreement to the plan.  [place], DO 07/16/18 10:53 AM

## 2018-09-14 MED FILL — ALBUTEROL SULFATE HFA 108 (: 108 (90 BAS | 17 days supply | Qty: 18 | Fill #0

## 2018-09-14 MED FILL — SYNTHROID 125 MCG TABLET: 125 | 90 days supply | Qty: 90 | Fill #1

## 2018-12-15 MED FILL — SYNTHROID 125 MCG TABLET: 125 | 90 days supply | Qty: 90 | Fill #2

## 2019-03-16 MED FILL — SYNTHROID 125 MCG TABLET: 125 | 90 days supply | Qty: 90 | Fill #3

## 2019-04-06 ENCOUNTER — Other Ambulatory Visit: Payer: Self-pay

## 2019-04-06 ENCOUNTER — Ambulatory Visit (INDEPENDENT_AMBULATORY_CARE_PROVIDER_SITE_OTHER): Payer: 59 | Admitting: Obstetrics & Gynecology

## 2019-04-06 ENCOUNTER — Encounter: Payer: Self-pay | Admitting: Obstetrics & Gynecology

## 2019-04-06 VITALS — BP 147/68 | HR 70 | Ht 65.0 in | Wt 198.1 lb

## 2019-04-06 DIAGNOSIS — Z1151 Encounter for screening for human papillomavirus (HPV): Secondary | ICD-10-CM | POA: Diagnosis not present

## 2019-04-06 DIAGNOSIS — N8111 Cystocele, midline: Secondary | ICD-10-CM

## 2019-04-06 DIAGNOSIS — Z01419 Encounter for gynecological examination (general) (routine) without abnormal findings: Secondary | ICD-10-CM

## 2019-04-06 DIAGNOSIS — Z124 Encounter for screening for malignant neoplasm of cervix: Secondary | ICD-10-CM

## 2019-04-06 NOTE — Patient Instructions (Signed)

## 2019-04-06 NOTE — Progress Notes (Signed)
Subjective:     April Stone is a 59 y.o. female here for a routine exam. G3P3 Current complaints: pt feels a protrusion from the vagina. Her mother had surgery for prolapse. She hopes she does not need surgery at this time. Pt reports occ leakage of urine with jumping and occ has urgency at night. She does not consider either of these a problem.   Pt reports LP at age 39 years. NO bleeding since that time.    Pt reports multiple life stressors: Her oldest son was killed 98 years prev. She had thyroid cancer- she is s/p treatment. Her husband has prostate cancer and is undergoing treatment. He is working and undergoing chemo which is considered palliative. Pt father has dementia and is likely needing a nursing home at present. Her mother has not been taking it well.     Gynecologic History Patient's last menstrual period was 08/01/2013. Contraception: post menopausal status Last Pap: 03/15/2014. Results were: normal Last mammogram: 05/13/2017. Results were: normal  Obstetric History OB History  Gravida Para Term Preterm AB Living  3 3 3     3   SAB TAB Ectopic Multiple Live Births          3    # Outcome Date GA Lbr Len/2nd Weight Sex Delivery Anes PTL Lv  3 Term 61 [redacted]w[redacted]d   M Vag-Spont EPI N LIV  2 Term 1989 [redacted]w[redacted]d   M Vag-Spont EPI N LIV  1 Term 67 [redacted]w[redacted]d   M Vag-Spont EPI N LIV     The following portions of the patient's history were reviewed and updated as appropriate: allergies, current medications, past family history, past medical history, past social history, past surgical history and problem list.  Review of Systems Pertinent items are noted in HPI.    Objective:  BP (!) 154/87   Pulse 68   Ht 5\' 5"  (1.651 m)   Wt 198 lb 1.9 oz (89.9 kg)   LMP 08/01/2013   BMI 32.97 kg/m  General Appearance:    Alert, cooperative, no distress, appears stated age  Head:    Normocephalic, without obvious abnormality, atraumatic  Eyes:    conjunctiva/corneas clear, EOM's intact, both  eyes  Ears:    Normal external ear canals, both ears  Nose:   Nares normal, septum midline, mucosa normal, no drainage    or sinus tenderness  Throat:   Lips, mucosa, and tongue normal; teeth and gums normal  Neck:   Supple, symmetrical, trachea midline, no adenopathy;    thyroid:  no enlargement/tenderness/nodules  Back:     Symmetric, no curvature, ROM normal, no CVA tenderness  Lungs:     respirations unlabored  Chest Wall:    No tenderness or deformity   Heart:    Regular rate and rhythm  Breast Exam:    No tenderness, masses, or nipple abnormality  Abdomen:     Soft, non-tender, bowel sounds active all four quadrants,    no masses, no organomegaly  Genitalia:    Normal female without lesion, discharge or tenderness   Mild uterine prolapse; bladder is prolapsed with valsalva to introitus.   Extremities:   Extremities normal, atraumatic, no cyanosis or edema  Pulses:   2+ and symmetric all extremities  Skin:   Skin color, texture, turgor normal, no rashes or lesions     Assessment:    Healthy female exam.   Midline cystocele- Reviewed options for management.  Pt is not ready for surgery at present but,  feels that she may need it in the future. She declines Pessary. Wants to wait for now- expectant management.    Incontinence- this is not considered a problem for pt. Pt declines PT Life stressors- pt feels like she is handling these well Elevated blood pressure- Pt has checked at work and it has been WNL       Plan:   F/u PAP with hr HPV Mammogram  F/u in 6 months or sooner prn Kegels reviewed  Hancel Ion L. Harraway-Smith, M.D., Cherlynn June

## 2019-04-07 LAB — CYTOLOGY - PAP
Comment: NEGATIVE
Diagnosis: NEGATIVE
High risk HPV: NEGATIVE

## 2019-04-21 ENCOUNTER — Other Ambulatory Visit (HOSPITAL_BASED_OUTPATIENT_CLINIC_OR_DEPARTMENT_OTHER): Payer: Self-pay | Admitting: Internal Medicine

## 2019-04-21 DIAGNOSIS — E890 Postprocedural hypothyroidism: Secondary | ICD-10-CM | POA: Diagnosis not present

## 2019-04-21 DIAGNOSIS — C73 Malignant neoplasm of thyroid gland: Secondary | ICD-10-CM | POA: Diagnosis not present

## 2019-04-28 ENCOUNTER — Other Ambulatory Visit: Payer: Self-pay

## 2019-04-28 ENCOUNTER — Ambulatory Visit (HOSPITAL_BASED_OUTPATIENT_CLINIC_OR_DEPARTMENT_OTHER)
Admission: RE | Admit: 2019-04-28 | Discharge: 2019-04-28 | Disposition: A | Discharge status: Home / Self Care | Payer: 59 | Source: Ambulatory Visit | Attending: Obstetrics & Gynecology | Admitting: Obstetrics & Gynecology

## 2019-04-28 DIAGNOSIS — Z1231 Encounter for screening mammogram for malignant neoplasm of breast: Secondary | ICD-10-CM | POA: Diagnosis not present

## 2019-04-28 DIAGNOSIS — Z01419 Encounter for gynecological examination (general) (routine) without abnormal findings: Secondary | ICD-10-CM

## 2019-07-18 ENCOUNTER — Encounter: Payer: 59 | Admitting: Family Medicine

## 2019-09-07 MED FILL — SYNTHROID 125 MCG TABLET: 125 | 90 days supply | Qty: 90 | Fill #1

## 2019-11-10 DIAGNOSIS — H5203 Hypermetropia, bilateral: Secondary | ICD-10-CM | POA: Diagnosis not present

## 2019-12-07 MED FILL — SYNTHROID 125 MCG TABLET: 125 | 90 days supply | Qty: 90 | Fill #2

## 2020-03-07 MED FILL — SYNTHROID 125 MCG TABLET: 125 | 90 days supply | Qty: 90 | Fill #3

## 2020-04-25 DIAGNOSIS — C73 Malignant neoplasm of thyroid gland: Secondary | ICD-10-CM | POA: Diagnosis not present

## 2020-04-25 DIAGNOSIS — E890 Postprocedural hypothyroidism: Secondary | ICD-10-CM | POA: Diagnosis not present

## 2020-05-11 ENCOUNTER — Other Ambulatory Visit (HOSPITAL_BASED_OUTPATIENT_CLINIC_OR_DEPARTMENT_OTHER): Payer: Self-pay

## 2020-05-11 MED ORDER — SYNTHROID 125 MCG PO TABS
125.0000 ug | ORAL_TABLET | Freq: Every day | ORAL | 3 refills | Status: DC
  Filled 2020-05-11 – 2020-06-03 (×2): qty 90, 90d supply, fill #0
  Filled 2020-08-22: qty 90, 90d supply, fill #1
  Filled 2020-12-03: qty 90, 90d supply, fill #2
  Filled 2021-02-27: qty 90, 90d supply, fill #3

## 2020-06-04 ENCOUNTER — Other Ambulatory Visit (HOSPITAL_BASED_OUTPATIENT_CLINIC_OR_DEPARTMENT_OTHER): Payer: Self-pay

## 2020-07-04 ENCOUNTER — Other Ambulatory Visit (HOSPITAL_BASED_OUTPATIENT_CLINIC_OR_DEPARTMENT_OTHER): Payer: Self-pay

## 2020-07-04 ENCOUNTER — Other Ambulatory Visit: Payer: Self-pay

## 2020-07-04 ENCOUNTER — Ambulatory Visit (INDEPENDENT_AMBULATORY_CARE_PROVIDER_SITE_OTHER): Payer: 59 | Admitting: Family Medicine

## 2020-07-04 ENCOUNTER — Encounter: Payer: Self-pay | Admitting: Family Medicine

## 2020-07-04 VITALS — BP 128/86 | HR 67 | Temp 97.5°F | Ht 64.0 in | Wt 191.2 lb

## 2020-07-04 DIAGNOSIS — Z1211 Encounter for screening for malignant neoplasm of colon: Secondary | ICD-10-CM

## 2020-07-04 DIAGNOSIS — Z0000 Encounter for general adult medical examination without abnormal findings: Secondary | ICD-10-CM

## 2020-07-04 DIAGNOSIS — J4520 Mild intermittent asthma, uncomplicated: Secondary | ICD-10-CM | POA: Diagnosis not present

## 2020-07-04 DIAGNOSIS — Z23 Encounter for immunization: Secondary | ICD-10-CM

## 2020-07-04 LAB — CBC
HCT: 43.7 % (ref 36.0–46.0)
Hemoglobin: 14.6 g/dL (ref 12.0–15.0)
MCHC: 33.4 g/dL (ref 30.0–36.0)
MCV: 91.4 fl (ref 78.0–100.0)
Platelets: 145 10*3/uL — ABNORMAL LOW (ref 150.0–400.0)
RBC: 4.78 Mil/uL (ref 3.87–5.11)
RDW: 13.4 % (ref 11.5–15.5)
WBC: 3.8 10*3/uL — ABNORMAL LOW (ref 4.0–10.5)

## 2020-07-04 LAB — COMPREHENSIVE METABOLIC PANEL
ALT: 22 U/L (ref 0–35)
AST: 21 U/L (ref 0–37)
Albumin: 4.3 g/dL (ref 3.5–5.2)
Alkaline Phosphatase: 66 U/L (ref 39–117)
BUN: 21 mg/dL (ref 6–23)
CO2: 27 mEq/L (ref 19–32)
Calcium: 9.1 mg/dL (ref 8.4–10.5)
Chloride: 106 mEq/L (ref 96–112)
Creatinine, Ser: 0.66 mg/dL (ref 0.40–1.20)
GFR: 95.91 mL/min (ref >60.00)
Glucose, Bld: 95 mg/dL (ref 70–99)
Potassium: 4 mEq/L (ref 3.5–5.1)
Sodium: 139 mEq/L (ref 135–145)
Total Bilirubin: 0.9 mg/dL (ref 0.2–1.2)
Total Protein: 6.8 g/dL (ref 6.0–8.3)

## 2020-07-04 LAB — LIPID PANEL
Cholesterol: 200 mg/dL (ref 0–200)
HDL: 77.9 mg/dL (ref >39.00)
LDL Cholesterol: 113 mg/dL — ABNORMAL HIGH (ref 0–99)
NonHDL: 122.3
Total CHOL/HDL Ratio: 3
Triglycerides: 49 mg/dL (ref 0.0–149.0)
VLDL: 9.8 mg/dL (ref 0.0–40.0)

## 2020-07-04 MED ORDER — ALBUTEROL SULFATE HFA 108 (90 BASE) MCG/ACT IN AERS
2.0000 | INHALATION_SPRAY | RESPIRATORY_TRACT | 2 refills | Status: DC | PRN
  Filled 2020-07-04: qty 18, 17d supply, fill #0

## 2020-07-04 NOTE — Progress Notes (Signed)
Chief Complaint  Patient presents with  . Annual Exam     Well Woman April Stone is here for a complete physical.   Her last physical was >1 year ago.  Current diet: in general, a "healthy" diet. Current exercise: walking, active in yard. Weight is stable and she denies fatigue out of ordinary. Seatbelt? Yes  Health Maintenance Pap/HPV- Yes Mammogram- Yes Colon cancer screening-No Shingrix- No Tetanus- Yes Hep C screening- Yes HIV screening- Yes  Past Medical History:  Diagnosis Date  . Anemia   . Asthma   . Thyroid cancer [facility])      Past Surgical History:  Procedure Laterality Date  . APPENDECTOMY    . BUNIONECTOMY    . THYROIDECTOMY    . TONSILLECTOMY      Medications  Current Outpatient Medications on File Prior to Visit  Medication Sig Dispense Refill  . albuterol (VENTOLIN HFA) 108 (90 Base) MCG/ACT inhaler Inhale 2 puffs into the lungs every 4 (four) hours as needed for wheezing or shortness of breath. 2puffs before exercise to prevent wheezing 18 g 2  . Cholecalciferol (VITAMIN D) 1000 UNITS capsule Take 2,000 Units by mouth daily.    . fluticasone (FLONASE) 50 MCG/ACT nasal spray 1 or 2 sprays each nostril twice a day 16 g 2  . ibuprofen (ADVIL) 200 MG tablet Take 200 mg by mouth every 6 (six) hours as needed.    [name] SYNTHROID 125 MCG tablet Take 1 tablet (125 mcg total) by mouth daily. 90 tablet 3    Allergies No Known Allergies  Review of Systems: Constitutional:  no unexpected weight changes Eye:  no recent significant change in vision Ear/Nose/Mouth/Throat:  Ears:  no recent change in hearing Nose/Mouth/Throat:  no complaints of nasal congestion, no sore throat Cardiovascular: no chest pain Respiratory:  no shortness of breath Gastrointestinal:  no abdominal pain, no change in bowel habits GU:  Female: negative for dysuria or pelvic pain Musculoskeletal/Extremities:  no new pain of the joints Integumentary (Skin/Breast):  no abnormal skin  lesions reported Neurologic:  no headaches Endocrine:  denies fatigue  Exam BP 128/86 (BP Location: Left Arm, Patient Position: Sitting, Cuff Size: Normal)   Pulse 67   Temp (!) 97.5 F (36.4 C) (Oral)   Ht 5\' 4"  (1.626 m)   Wt 191 lb 4 oz (86.8 kg)   LMP 08/01/2013   SpO2 98%   BMI 32.83 kg/m  General:  well developed, well nourished, in no apparent distress Skin:  no significant moles, warts, or growths Head:  no masses, lesions, or tenderness Eyes:  pupils equal and round, sclera anicteric without injection Ears:  canals without lesions, TMs shiny without retraction, no obvious effusion, no erythema Nose:  nares patent, septum midline, mucosa normal, and no drainage or sinus tenderness Throat/Pharynx:  lips and gingiva without lesion; tongue and uvula midline; non-inflamed pharynx; no exudates or postnasal drainage Neck: neck supple without adenopathy, thyromegaly, or masses Lungs:  clear to auscultation, breath sounds equal bilaterally, no respiratory distress Cardio:  regular rate and rhythm, no LE edema Abdomen:  abdomen soft, nontender; bowel sounds normal; no masses or organomegaly Genital: Defer to GYN Musculoskeletal:  symmetrical muscle groups noted without atrophy or deformity Extremities:  no clubbing, cyanosis, or edema, no deformities, no skin discoloration Neuro:  gait normal; deep tendon reflexes normal and symmetric Psych: well oriented with normal range of affect and appropriate judgment/insight  Assessment and Plan  Well adult exam - Plan: CBC, Comprehensive metabolic panel,  Lipid panel  Screen for colon cancer - Plan: Ambulatory referral to Gastroenterology  Mild intermittent asthma without complication - Plan: albuterol (VENTOLIN HFA) 108 (90 Base) MCG/ACT inhaler   Well 60 y.o. female. Counseled on diet and exercise. Shingrix info discussed, 1st shot today, next in 3 mo.  Refer GI for CCS.  Other orders as above. Follow up in 1 yr or prn. The  patient voiced understanding and agreement to the plan.  [place], DO 07/04/20 9:39 AM

## 2020-07-04 NOTE — Addendum Note (Signed)
Addended by: Sharon Seller B on: 07/04/2020 09:52 AM   Modules accepted: Orders

## 2020-07-04 NOTE — Patient Instructions (Addendum)
Give us 2-3 business days to get the results of your labs back.   Keep the diet clean and stay active.  If you do not hear anything about your referral in the next 1-2 weeks, call our office and ask for an update.  Let us know if you need anything. 

## 2020-07-16 ENCOUNTER — Other Ambulatory Visit (HOSPITAL_BASED_OUTPATIENT_CLINIC_OR_DEPARTMENT_OTHER): Payer: Self-pay

## 2020-08-22 ENCOUNTER — Other Ambulatory Visit (HOSPITAL_BASED_OUTPATIENT_CLINIC_OR_DEPARTMENT_OTHER): Payer: Self-pay

## 2020-08-30 DIAGNOSIS — Z20822 Contact with and (suspected) exposure to covid-19: Secondary | ICD-10-CM | POA: Diagnosis not present

## 2020-10-03 ENCOUNTER — Ambulatory Visit: Payer: 59

## 2020-10-03 NOTE — Progress Notes (Deleted)
Patient here for second shingles vaccine.  Vaccine given in      .  Patient tolerated well.

## 2020-12-03 ENCOUNTER — Other Ambulatory Visit (HOSPITAL_BASED_OUTPATIENT_CLINIC_OR_DEPARTMENT_OTHER): Payer: Self-pay

## 2021-02-27 ENCOUNTER — Other Ambulatory Visit (HOSPITAL_BASED_OUTPATIENT_CLINIC_OR_DEPARTMENT_OTHER): Payer: Self-pay

## 2021-03-30 DIAGNOSIS — H10021 Other mucopurulent conjunctivitis, right eye: Secondary | ICD-10-CM | POA: Diagnosis not present

## 2021-04-24 DIAGNOSIS — E890 Postprocedural hypothyroidism: Secondary | ICD-10-CM | POA: Diagnosis not present

## 2021-04-24 DIAGNOSIS — C73 Malignant neoplasm of thyroid gland: Secondary | ICD-10-CM | POA: Diagnosis not present

## 2021-04-25 ENCOUNTER — Other Ambulatory Visit (HOSPITAL_BASED_OUTPATIENT_CLINIC_OR_DEPARTMENT_OTHER): Payer: Self-pay

## 2021-04-25 MED ORDER — SYNTHROID 125 MCG PO TABS
125.0000 ug | ORAL_TABLET | Freq: Every day | ORAL | 3 refills | Status: AC
  Filled 2021-04-25 – 2021-05-29 (×2): qty 90, 90d supply, fill #0
  Filled 2021-08-30: qty 90, 90d supply, fill #1
  Filled 2021-11-25: qty 90, 90d supply, fill #2
  Filled 2022-02-26: qty 90, 90d supply, fill #3

## 2021-05-29 ENCOUNTER — Other Ambulatory Visit (HOSPITAL_BASED_OUTPATIENT_CLINIC_OR_DEPARTMENT_OTHER): Payer: Self-pay

## 2021-08-30 ENCOUNTER — Other Ambulatory Visit (HOSPITAL_BASED_OUTPATIENT_CLINIC_OR_DEPARTMENT_OTHER): Payer: Self-pay

## 2021-09-03 ENCOUNTER — Encounter: Payer: 59 | Admitting: Family Medicine

## 2021-09-17 ENCOUNTER — Encounter: Payer: 59 | Admitting: Family Medicine

## 2021-09-24 ENCOUNTER — Encounter: Payer: Self-pay | Admitting: Family Medicine

## 2021-09-24 ENCOUNTER — Ambulatory Visit (INDEPENDENT_AMBULATORY_CARE_PROVIDER_SITE_OTHER): Payer: 59 | Admitting: Family Medicine

## 2021-09-24 ENCOUNTER — Other Ambulatory Visit (HOSPITAL_BASED_OUTPATIENT_CLINIC_OR_DEPARTMENT_OTHER): Payer: Self-pay

## 2021-09-24 VITALS — BP 110/80 | HR 74 | Temp 98.0°F | Ht 65.0 in | Wt 178.4 lb

## 2021-09-24 DIAGNOSIS — Z1211 Encounter for screening for malignant neoplasm of colon: Secondary | ICD-10-CM

## 2021-09-24 DIAGNOSIS — Z23 Encounter for immunization: Secondary | ICD-10-CM | POA: Diagnosis not present

## 2021-09-24 DIAGNOSIS — Z1231 Encounter for screening mammogram for malignant neoplasm of breast: Secondary | ICD-10-CM | POA: Diagnosis not present

## 2021-09-24 DIAGNOSIS — J4520 Mild intermittent asthma, uncomplicated: Secondary | ICD-10-CM | POA: Diagnosis not present

## 2021-09-24 DIAGNOSIS — Z0000 Encounter for general adult medical examination without abnormal findings: Secondary | ICD-10-CM | POA: Diagnosis not present

## 2021-09-24 LAB — CBC
HCT: 43.3 % (ref 36.0–46.0)
Hemoglobin: 14.4 g/dL (ref 12.0–15.0)
MCHC: 33.1 g/dL (ref 30.0–36.0)
MCV: 93.2 fl (ref 78.0–100.0)
Platelets: 157 10*3/uL (ref 150.0–400.0)
RBC: 4.65 Mil/uL (ref 3.87–5.11)
RDW: 13.7 % (ref 11.5–15.5)
WBC: 4.7 10*3/uL (ref 4.0–10.5)

## 2021-09-24 MED ORDER — ALBUTEROL SULFATE HFA 108 (90 BASE) MCG/ACT IN AERS
2.0000 | INHALATION_SPRAY | RESPIRATORY_TRACT | 2 refills | Status: DC | PRN
  Filled 2021-09-24: qty 6.7, 17d supply, fill #0
  Filled 2022-02-26: qty 6.7, 17d supply, fill #1
  Filled 2022-09-05: qty 6.7, 17d supply, fill #2

## 2021-09-24 NOTE — Progress Notes (Signed)
Chief Complaint  Patient presents with   Annual Exam     Well Woman CHEVETTE FEE is here for a complete physical.   Her last physical was >1 year ago.  Current diet: in general, a "healthy" diet. Current exercise: CrossFit, kayaking. Weight is intentionally losing and she denies fatigue out of ordinary. Seatbelt? Yes Advanced directive? No  Health Maintenance Pap/HPV- Yes Mammogram- Due Colon cancer screening-Due, setting up Cologard Shingrix- Due for #2 Tetanus- Yes Hep C screening- Yes HIV screening- Yes  Past Medical History:  Diagnosis Date   Anemia    Asthma    Thyroid cancer ([place])      Past Surgical History:  Procedure Laterality Date   APPENDECTOMY     BUNIONECTOMY     THYROIDECTOMY     TONSILLECTOMY      Medications  Current Outpatient Medications on File Prior to Visit  Medication Sig Dispense Refill   Cholecalciferol (VITAMIN D) 1000 UNITS capsule Take 2,000 Units by mouth daily.     ibuprofen (ADVIL) 200 MG tablet Take 200 mg by mouth every 6 (six) hours as needed.     SYNTHROID 125 MCG tablet Take 1 tablet (125 mcg total) by mouth daily. 90 tablet 3   Allergies No Known Allergies  Review of Systems: Constitutional:  no unexpected weight changes Eye:  no recent significant change in vision Ear/Nose/Mouth/Throat:  Ears:  no recent change in hearing Nose/Mouth/Throat:  no complaints of nasal congestion, no sore throat Cardiovascular: no chest pain Respiratory:  no shortness of breath Gastrointestinal:  no abdominal pain, no change in bowel habits GU:  Female: negative for dysuria or pelvic pain Musculoskeletal/Extremities:  no pain of the joints Integumentary (Skin/Breast):  no abnormal skin lesions reported Neurologic:  no headaches Endocrine:  denies fatigue  Exam BP 110/80   Pulse 74   Temp 98 F (36.7 C) (Oral)   Ht '5\' 5"'$  (1.651 m)   Wt 178 lb 6 oz (80.9 kg)   LMP 08/01/2013   SpO2 97%   BMI 29.68 kg/m  General:  well  developed, well nourished, in no apparent distress Skin:  no significant moles, warts, or growths Head:  no masses, lesions, or tenderness Eyes:  pupils equal and round, sclera anicteric without injection Ears:  canals without lesions, TMs shiny without retraction, no obvious effusion, no erythema Nose:  nares patent, septum midline, mucosa normal, and no drainage or sinus tenderness Throat/Pharynx:  lips and gingiva without lesion; tongue and uvula midline; non-inflamed pharynx; no exudates or postnasal drainage Neck: neck supple without adenopathy, thyromegaly, or masses Lungs:  clear to auscultation, breath sounds equal bilaterally, no respiratory distress Cardio:  regular rate and rhythm, no LE edema Abdomen:  abdomen soft, nontender; bowel sounds normal; no masses or organomegaly Genital: Defer to GYN Musculoskeletal:  symmetrical muscle groups noted without atrophy or deformity Extremities:  no clubbing, cyanosis, or edema, no deformities, no skin discoloration Neuro:  gait normal; deep tendon reflexes normal and symmetric Psych: well oriented with normal range of affect and appropriate judgment/insight  Assessment and Plan  Well adult exam - Plan: CBC, Comprehensive metabolic panel, Lipid panel  Mild intermittent asthma without complication - Plan: albuterol (VENTOLIN HFA) 108 (90 Base) MCG/ACT inhaler  Encounter for screening mammogram for malignant neoplasm of breast - Plan: MM DIGITAL SCREENING BILATERAL  Colon cancer screening - Plan: Cologuard   Well 61 y.o. female. Counseled on diet and exercise. Other orders as above. Shingrix #2 today. Advanced directive form provided  today.  Cologard for CCS, discussed preference for colonoscopy. Will order mammogram today. Follow up in 1 year or as needed. The patient voiced understanding and agreement to the plan.  [place], DO 09/24/21 10:48 AM

## 2021-09-24 NOTE — Patient Instructions (Addendum)
Give [place] 2-3 business days to get the results of your labs back.   Aim to do some physical exertion for 150 minutes per week. This is typically divided into 5 days per week, 30 minutes per day. The activity should be enough to get your heart rate up. Anything is better than nothing if you have time constraints.  I recommend getting the flu shot in mid October. This suggestion would change if the CDC comes out with a different recommendation.   Cologard kit will arrive in the mail.  Please get me a copy of your advanced directive form at your convenience.   Let us know if you need anything.

## 2021-09-25 LAB — COMPREHENSIVE METABOLIC PANEL
ALT: 24 U/L (ref 0–35)
AST: 18 U/L (ref 0–37)
Albumin: 4.2 g/dL (ref 3.5–5.2)
Alkaline Phosphatase: 47 U/L (ref 39–117)
BUN: 25 mg/dL — ABNORMAL HIGH (ref 6–23)
CO2: 25 mEq/L (ref 19–32)
Calcium: 9.1 mg/dL (ref 8.4–10.5)
Chloride: 106 mEq/L (ref 96–112)
Creatinine, Ser: 0.76 mg/dL (ref 0.40–1.20)
GFR: 84.94 mL/min (ref >60.00)
Glucose, Bld: 94 mg/dL (ref 70–99)
Potassium: 4.2 mEq/L (ref 3.5–5.1)
Sodium: 140 mEq/L (ref 135–145)
Total Bilirubin: 0.7 mg/dL (ref 0.2–1.2)
Total Protein: 6.8 g/dL (ref 6.0–8.3)

## 2021-09-25 LAB — LIPID PANEL
Cholesterol: 237 mg/dL — ABNORMAL HIGH (ref 0–200)
HDL: 89.9 mg/dL (ref >39.00)
LDL Cholesterol: 136 mg/dL — ABNORMAL HIGH (ref 0–99)
NonHDL: 147.02
Total CHOL/HDL Ratio: 3
Triglycerides: 53 mg/dL (ref 0.0–149.0)
VLDL: 10.6 mg/dL (ref 0.0–40.0)

## 2021-10-01 ENCOUNTER — Telehealth (HOSPITAL_BASED_OUTPATIENT_CLINIC_OR_DEPARTMENT_OTHER): Payer: Self-pay

## 2021-10-08 DIAGNOSIS — Z1211 Encounter for screening for malignant neoplasm of colon: Secondary | ICD-10-CM | POA: Diagnosis not present

## 2021-10-08 LAB — COLOGUARD: Cologuard: NEGATIVE

## 2021-10-13 LAB — COLOGUARD: COLOGUARD: NEGATIVE

## 2021-11-01 IMAGING — MG DIGITAL SCREENING BILAT W/ TOMO W/ CAD
8 series · 8 of 24 positions shown · non-contrast
Comparison: Previous exam(s).

CLINICAL DATA: Screening.

EXAM:
DIGITAL SCREENING BILATERAL MAMMOGRAM WITH TOMO AND CAD

[R MLO synth-2D]
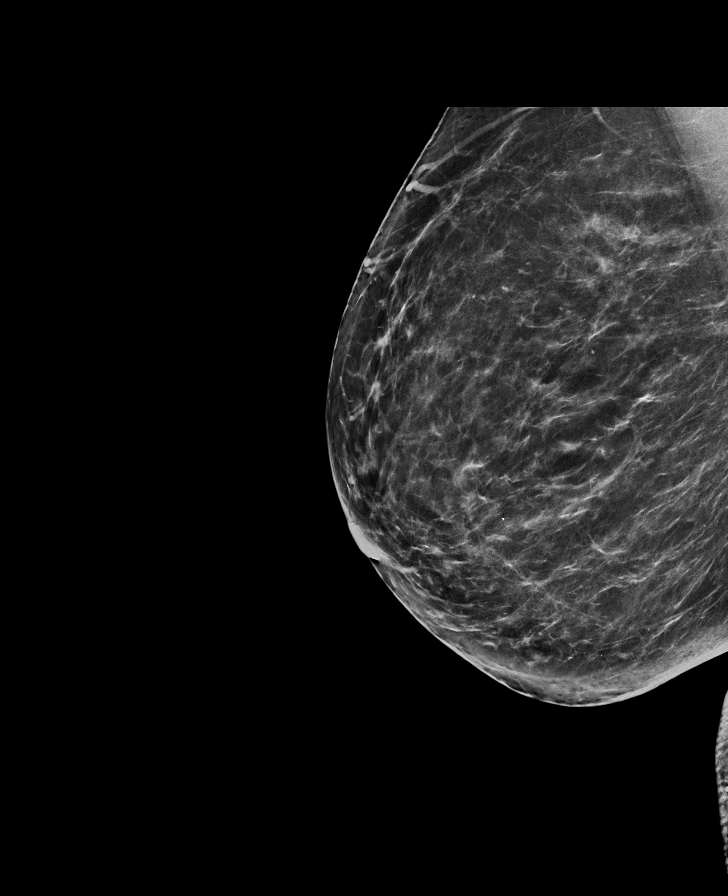

[R CC synth-2D]
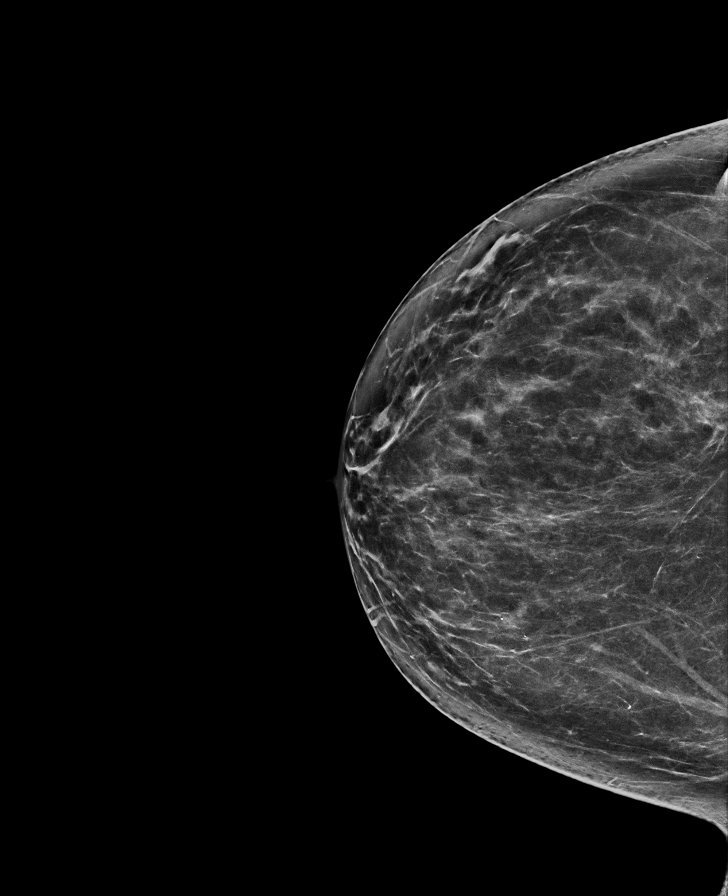

[L CC synth-2D]
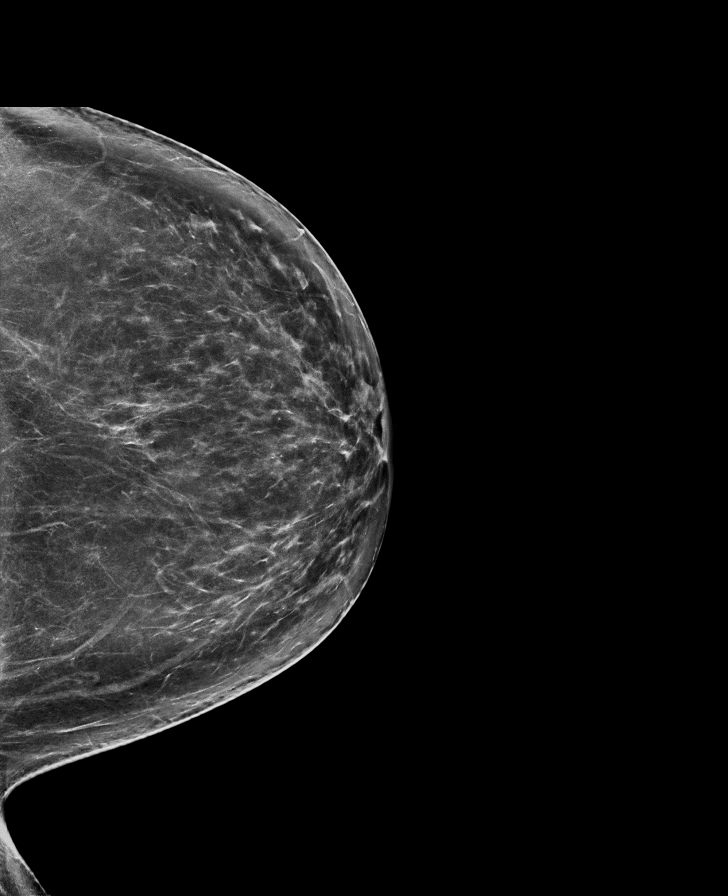

[L MLO synth-2D]
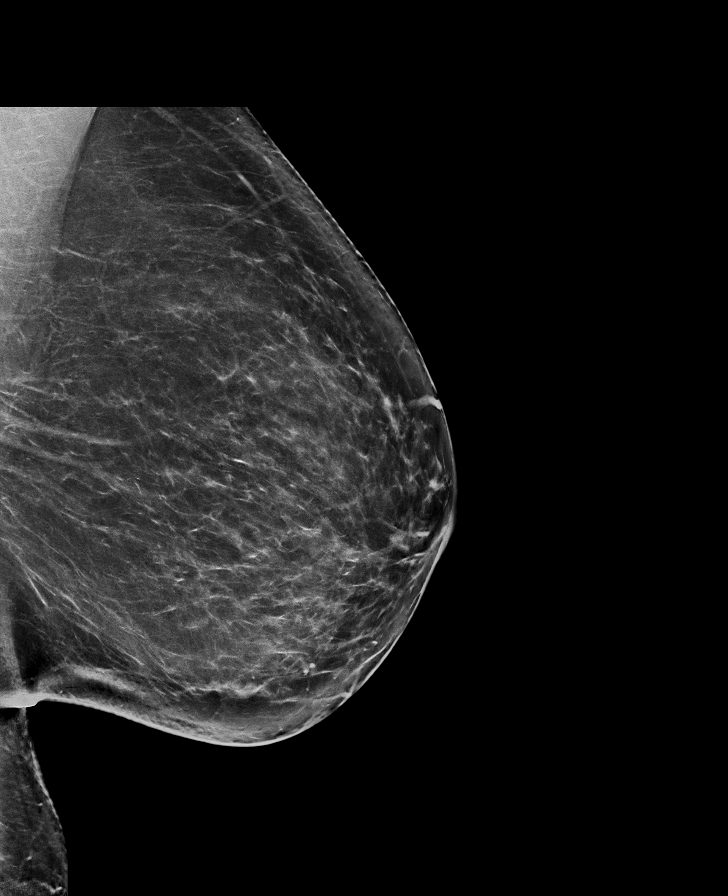

[R CC tomo · tomo slice 40/79.0]
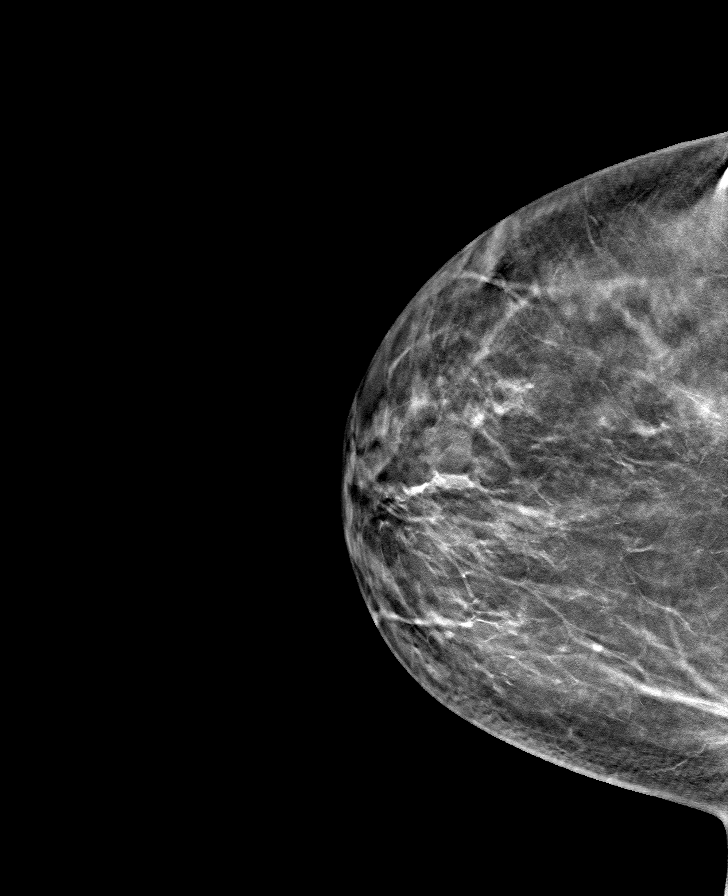

[L MLO tomo · tomo slice 43/84.0]
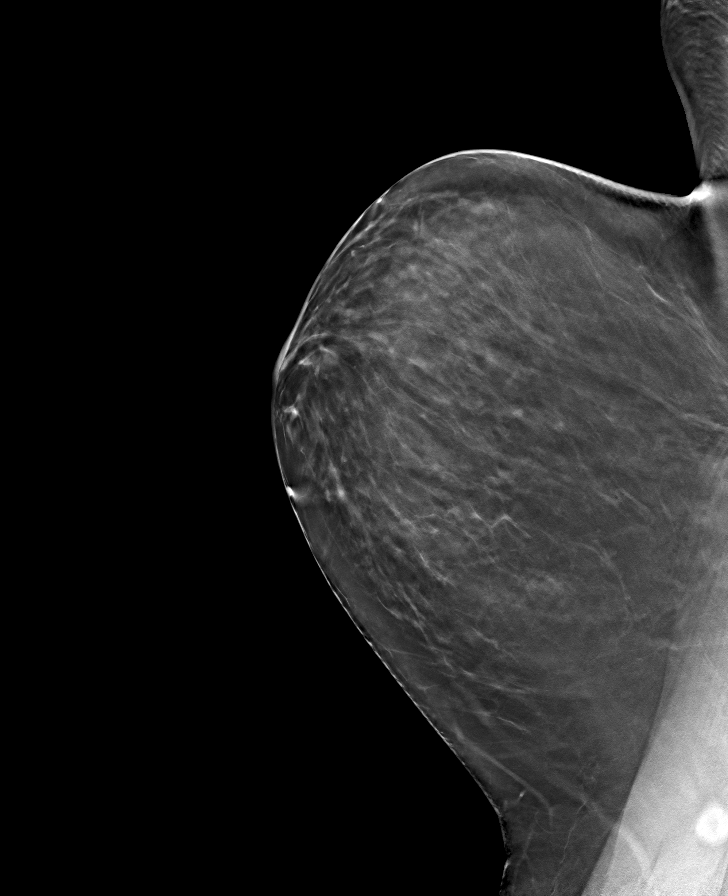

[L CC tomo · tomo slice 41/82.0]
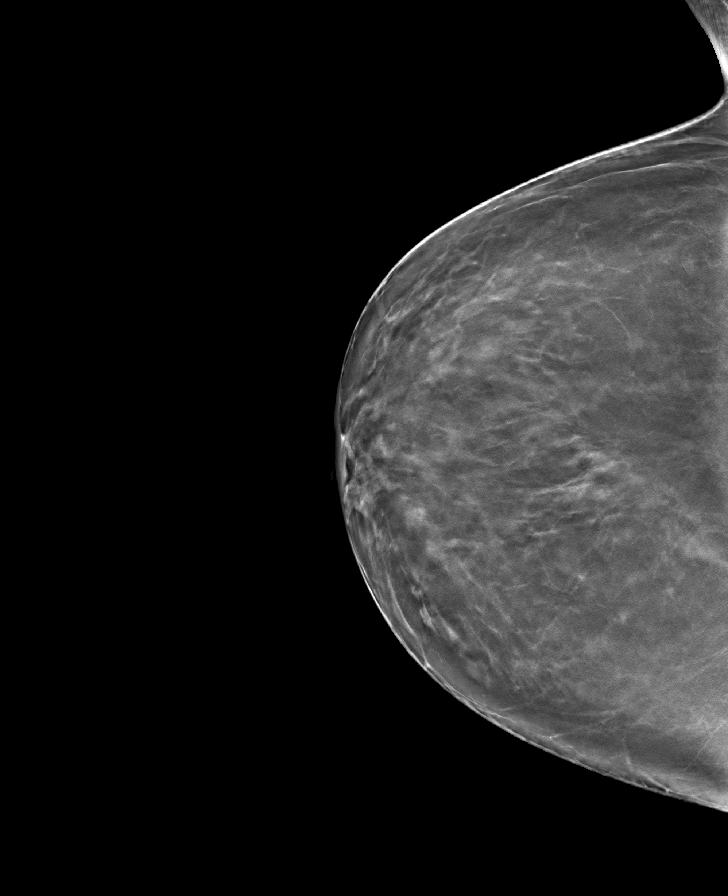

[R MLO tomo · tomo slice 41/80.0]
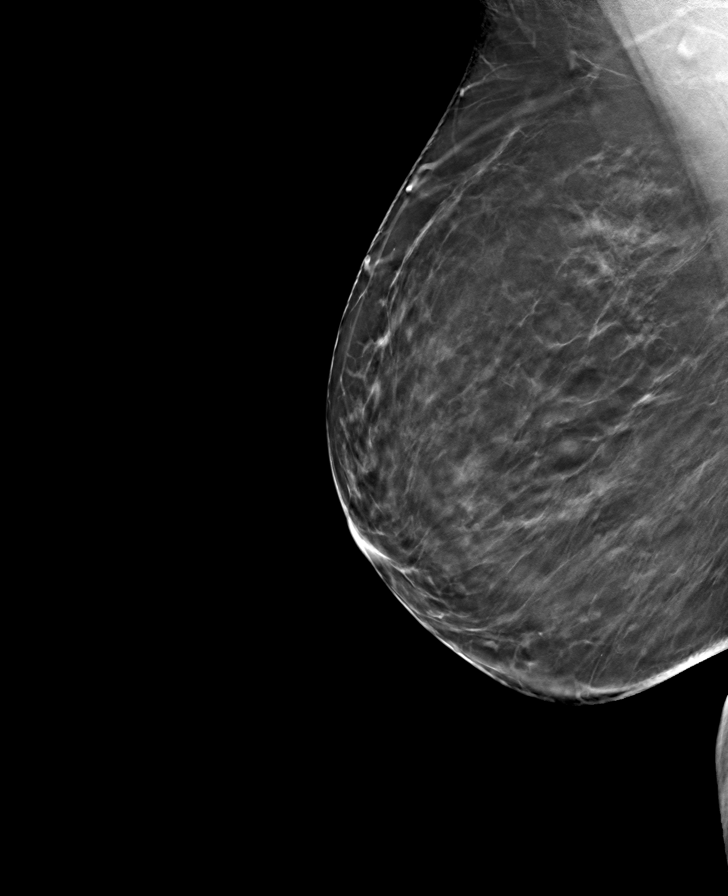

[8 of 24 positions shown; findings below may reference images not displayed]

ACR Breast Density Category b: There are scattered areas of
fibroglandular density.
FINDINGS: There are no findings suspicious for malignancy. Images were
processed with CAD.
IMPRESSION: No mammographic evidence of malignancy. A result letter of this
screening mammogram will be mailed directly to the patient.

RECOMMENDATION:
Screening mammogram in one year. (Code:[id number])

BI-RADS CATEGORY  1: Negative.

## 2021-11-08 ENCOUNTER — Telehealth (HOSPITAL_BASED_OUTPATIENT_CLINIC_OR_DEPARTMENT_OTHER): Payer: Self-pay

## 2021-11-25 ENCOUNTER — Other Ambulatory Visit (HOSPITAL_BASED_OUTPATIENT_CLINIC_OR_DEPARTMENT_OTHER): Payer: Self-pay

## 2021-12-13 DIAGNOSIS — H5203 Hypermetropia, bilateral: Secondary | ICD-10-CM | POA: Diagnosis not present

## 2022-02-26 ENCOUNTER — Other Ambulatory Visit (HOSPITAL_BASED_OUTPATIENT_CLINIC_OR_DEPARTMENT_OTHER): Payer: Self-pay

## 2022-04-23 DIAGNOSIS — Z85850 Personal history of malignant neoplasm of thyroid: Secondary | ICD-10-CM | POA: Diagnosis not present

## 2022-04-23 DIAGNOSIS — E890 Postprocedural hypothyroidism: Secondary | ICD-10-CM | POA: Diagnosis not present

## 2022-04-23 DIAGNOSIS — Z79890 Hormone replacement therapy (postmenopausal): Secondary | ICD-10-CM | POA: Diagnosis not present

## 2022-04-23 DIAGNOSIS — C73 Malignant neoplasm of thyroid gland: Secondary | ICD-10-CM | POA: Diagnosis not present

## 2022-08-11 ENCOUNTER — Ambulatory Visit: Payer: Commercial Managed Care - PPO | Admitting: Obstetrics and Gynecology

## 2022-08-11 ENCOUNTER — Other Ambulatory Visit (HOSPITAL_BASED_OUTPATIENT_CLINIC_OR_DEPARTMENT_OTHER): Payer: Self-pay

## 2022-08-11 ENCOUNTER — Encounter: Payer: Self-pay | Admitting: Obstetrics and Gynecology

## 2022-08-11 VITALS — BP 133/81 | HR 79 | Ht 62.5 in | Wt 176.0 lb

## 2022-08-11 DIAGNOSIS — R350 Frequency of micturition: Secondary | ICD-10-CM | POA: Diagnosis not present

## 2022-08-11 DIAGNOSIS — N814 Uterovaginal prolapse, unspecified: Secondary | ICD-10-CM | POA: Diagnosis not present

## 2022-08-11 DIAGNOSIS — N393 Stress incontinence (female) (male): Secondary | ICD-10-CM

## 2022-08-11 DIAGNOSIS — N8110 Cystocele, unspecified: Secondary | ICD-10-CM

## 2022-08-11 DIAGNOSIS — N952 Postmenopausal atrophic vaginitis: Secondary | ICD-10-CM | POA: Diagnosis not present

## 2022-08-11 LAB — POCT URINALYSIS DIPSTICK
Bilirubin, UA: NEGATIVE
Glucose, UA: NEGATIVE
Ketones, UA: NEGATIVE
Leukocytes, UA: NEGATIVE
Nitrite, UA: NEGATIVE
Protein, UA: NEGATIVE
Spec Grav, UA: 1.01 (ref 1.010–1.025)
Urobilinogen, UA: 0.2 E.U./dL
pH, UA: 6.5 (ref 5.0–8.0)

## 2022-08-11 MED ORDER — ESTRADIOL 0.1 MG/GM VA CREA
0.5000 g | TOPICAL_CREAM | VAGINAL | 11 refills | Status: AC
Start: 2022-08-11 — End: ?
  Filled 2022-08-11: qty 42.5, 30d supply, fill #0
  Filled 2022-09-05: qty 42.5, 30d supply, fill #1
  Filled 2022-10-21: qty 42.5, 30d supply, fill #2
  Filled 2023-02-17: qty 42.5, 30d supply, fill #3
  Filled 2023-04-06: qty 42.5, 30d supply, fill #4
  Filled 2023-06-02: qty 42.5, 30d supply, fill #5
  Filled 2023-07-08: qty 42.5, 30d supply, fill #6
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
## 2022-08-11 NOTE — Patient Instructions (Addendum)
You have stage 2-3 out of 4 anterior vaginal wall prolapse, Stage 1-2 out of 4 uterine prolapse   We discussed two options for prolapse repair:  1) vaginal repair without mesh - Pros - safer, no mesh complications - Cons - not as strong as mesh repair, higher risk of recurrence  2) laparoscopic repair with mesh - Pros - stronger, better long-term success - Cons - risks of mesh implant (erosion into vagina or bladder, adhering to the rectum, pain) - these risks are lower than with a vaginal mesh but still exist   Start vaginal estrogen cream. Nightly for 2 weeks and then twice a week after.

## 2022-08-27 ENCOUNTER — Encounter (INDEPENDENT_AMBULATORY_CARE_PROVIDER_SITE_OTHER): Payer: Self-pay

## 2022-08-29 ENCOUNTER — Other Ambulatory Visit (HOSPITAL_BASED_OUTPATIENT_CLINIC_OR_DEPARTMENT_OTHER): Payer: Self-pay

## 2022-09-18 ENCOUNTER — Ambulatory Visit (INDEPENDENT_AMBULATORY_CARE_PROVIDER_SITE_OTHER): Payer: Commercial Managed Care - PPO | Admitting: Obstetrics and Gynecology

## 2022-09-18 VITALS — BP 150/73 | HR 70

## 2022-09-18 DIAGNOSIS — R350 Frequency of micturition: Secondary | ICD-10-CM

## 2022-09-19 ENCOUNTER — Encounter: Payer: Self-pay | Admitting: Obstetrics and Gynecology

## 2022-09-19 LAB — POCT URINALYSIS DIPSTICK
Bilirubin, UA: NEGATIVE
Blood, UA: NEGATIVE
Glucose, UA: NEGATIVE
Ketones, UA: NEGATIVE
Leukocytes, UA: NEGATIVE
Nitrite, UA: NEGATIVE
Protein, UA: NEGATIVE
Spec Grav, UA: 1.01 (ref 1.010–1.025)
Urobilinogen, UA: 0.2 E.U./dL
pH, UA: 6 (ref 5.0–8.0)

## 2022-09-19 NOTE — Progress Notes (Signed)
[facility] Urodynamics Procedure  Referring Physician: Sharlene Dory* Date of Procedure: 09/18/2022  NICOLETA DEMMITT is a 62 y.o. female who presents for urodynamic evaluation. Indication(s) for study: occult SUI  Vital Signs: BP (!) 150/73   Pulse 70   LMP 08/01/2013   Laboratory Results: A catheterized urine specimen revealed:  POC urine: Negative for all components   Voiding Diary: Not Done  Procedure Timeout:  The correct patient was verified and the correct procedure was verified. The patient was in the correct position and safety precautions were reviewed based on at the patient's history.  Urodynamic Procedure A 44F dual lumen urodynamics catheter was placed under sterile conditions into the patient's bladder. A 44F catheter was placed into the rectum in order to measure abdominal pressure. EMG patches were placed in the appropriate position.  All connections were confirmed and calibrations/adjusted made. Saline was instilled into the bladder through the dual lumen catheters.  Cough/valsalva pressures were measured periodically during filling.  Patient was allowed to void.  The bladder was then emptied of its residual.  UROFLOW: Revealed a Qmax of 18 mL/sec.  She voided 266 mL and had a residual of 200 mL.  It was a normal pattern and represented normal habits   CMG: This was performed with sterile water in the sitting position at a fill rate of 30 mL/min.    First sensation of fullness was 22 mLs,  First urge was 135 mLs,  Strong urge was 172 mLs and  Capacity was 490   mLs  Stress incontinence was not demonstrated Highest negative Barrier CLPP was 117 cmH20 at 200 ml. Highest negative Barrier VLPP was 43 cmH20 at 277 ml.  Detrusor function was overactive, with phasic contractions seen.  The first occurred at 97 mL to 10.3 cm of water and was not associated with urge.  Compliance:  Normal. End fill detrusor pressure was 3.8cmH20.  Calculated  compliance was 148mL/cmH20  UPP: MUCP with barrier reduction was 59 cm of water.    MICTURITION STUDY: Voiding was performed without reduction in the sitting position.  Pdet at Qmax was 49.3 cm of water.  Qmax was 18.4 mL/sec.  It was a normal pattern.  She voided 340 mL and had a residual of 150 mL.  It was a volitional void, sustained detrusor contraction was present and abdominal straining was not present  EMG: This was performed with patches.  She had voluntary contractions, recruitment with fill was present and urethral sphincter was relaxed with void.  The details of the procedure with the study tracings have been scanned into EPIC.   Urodynamic Impression:  1. Sensation was increased; capacity was normal 2. Stress Incontinence was not demonstrated at normal pressures; 3. Detrusor Overactivity was demonstrated without leakage. 4. Emptying was dysfunctional with a mildly elevated PVR for the micturition study ( ) and elevated on the initial uroflow ( ), a sustained detrusor contraction present,  abdominal straining not present, normal urethral sphincter activity on EMG.  Plan: - The patient will follow up  to discuss the findings and treatment options.

## 2022-09-19 NOTE — Patient Instructions (Signed)
Taking Care of Yourself after Urodynamics   Drink plenty of water for a day or two following your procedure. Try to have about 8 ounces (one cup) at a time, and do this 6 times or more per day unless you have fluid restrictitons AVOID irritative beverages such as coffee, tea, soda, alcoholic or citrus drinks for a day or two, as this may cause burning with urination. You may experience some discomfort or a burning sensation with urination after having this procedure. You can use over the counter Azo or pyridium to help with burning and follow the instructions on the packaging. If it does not improve within 1-2 days, or other symptoms appear (fever, chills, or difficulty urinating) call the office to speak to a nurse.  You may return to normal daily activities such as work, school, driving, exercising and housework on the day of the procedure.

## 2022-09-23 ENCOUNTER — Other Ambulatory Visit: Payer: Self-pay | Admitting: Family Medicine

## 2022-09-23 DIAGNOSIS — Z1231 Encounter for screening mammogram for malignant neoplasm of breast: Secondary | ICD-10-CM

## 2022-09-24 ENCOUNTER — Ambulatory Visit: Payer: Commercial Managed Care - PPO | Admitting: Obstetrics and Gynecology

## 2022-09-24 ENCOUNTER — Ambulatory Visit (INDEPENDENT_AMBULATORY_CARE_PROVIDER_SITE_OTHER): Payer: Commercial Managed Care - PPO

## 2022-09-24 ENCOUNTER — Encounter: Payer: Self-pay | Admitting: Obstetrics and Gynecology

## 2022-09-24 VITALS — BP 136/76 | HR 77

## 2022-09-24 DIAGNOSIS — Z1231 Encounter for screening mammogram for malignant neoplasm of breast: Secondary | ICD-10-CM

## 2022-09-24 DIAGNOSIS — N8110 Cystocele, unspecified: Secondary | ICD-10-CM

## 2022-09-24 DIAGNOSIS — N816 Rectocele: Secondary | ICD-10-CM

## 2022-09-24 DIAGNOSIS — N812 Incomplete uterovaginal prolapse: Secondary | ICD-10-CM | POA: Diagnosis not present

## 2022-09-24 NOTE — Patient Instructions (Signed)
Plan for surgery: Exam under anesthesia, robotic assisted total laparoscopic hysterectomy with bilateral salpingectomy, sacrocolpopexy, perineorrhaphy, cystoscopy

## 2022-09-24 NOTE — Progress Notes (Signed)
[facility] Return Visit  SUBJECTIVE  History of Present Illness: April Stone is a 62 y.o. female seen in follow-up for prolapse. She is interested in surgery. She underwent urodynamic testing.   Urodynamic Impression:  1. Sensation was increased; capacity was normal 2. Stress Incontinence was not demonstrated at normal pressures; 3. Detrusor Overactivity was demonstrated without leakage. 4. Emptying was dysfunctional with a mildly elevated PVR for the micturition study ( ) and elevated on the initial uroflow ( ), a sustained detrusor contraction present,  abdominal straining not present, normal urethral sphincter activity on EMG.  Past Medical History: Patient  has a past medical history of Anemia, Asthma, and Thyroid cancer (HCC).   Past Surgical History: She  has a past surgical history that includes Appendectomy; Thyroidectomy; Bunionectomy; and Tonsillectomy.   Medications: She has a current medication list which includes the following prescription(s): albuterol, vitamin d, estradiol, fluticasone, ibuprofen, and synthroid.   Allergies: Patient has No Known Allergies.   Social History: Patient  reports that she has never smoked. She has never used smokeless tobacco. She reports current alcohol use. She reports that she does not use drugs.      OBJECTIVE     Physical Exam: Vitals:   09/24/22 1320  BP: 136/76  Pulse: 77   Gen: No apparent distress, A&O x 3.  Detailed Urogynecologic Evaluation:  Normal external genitalia. On speculum, normal vaginal mucosa and normal appearing cervix. On bimanual, uterus is small, mobile and nontender.    POP-Q  0.5                                            Aa   0.5                                           Ba  -4.5                                              C   5.5                                            Gh  4                                            Pb  9                                             tvl   0                                            Ap  0  Bp  -8                                              D       ASSESSMENT AND PLAN    April Stone is a 62 y.o. with:  1. Prolapse of anterior vaginal wall   2. Prolapse of posterior vaginal wall   3. Uterovaginal prolapse, incomplete     Plan for surgery: Exam under anesthesia, robotic assisted total laparoscopic hysterectomy with bilateral salpingectomy, sacrocolpopexy, perineorrhaphy, cystoscopy  - We reviewed the patient's specific anatomic and functional findings, with the assistance of diagrams, and together finalized the above procedure. The planned surgical procedures were discussed along with the surgical risks outlined below, which were also provided on a detailed handout. Additional treatment options including expectant management, conservative management, medical management were discussed where appropriate.  We reviewed the benefits and risks of each treatment option.   General Surgical Risks: For all procedures, there are risks of bleeding, infection, damage to surrounding organs including but not limited to bowel, bladder, blood vessels, ureters and nerves, and need for further surgery if an injury were to occur. These risks are all low with minimally invasive surgery.   There are risks of numbness and weakness at any body site or buttock/rectal pain.  It is possible that baseline pain can be worsened by surgery, either with or without mesh. If surgery is vaginal, there is also a low risk of possible conversion to laparoscopy or open abdominal incision where indicated. Very rare risks include blood transfusion, blood clot, heart attack, pneumonia, or death.   There is also a risk of short-term postoperative urinary retention with need to use a catheter. About half of patients need to go home from surgery with a catheter, which is then later removed in the office. The  risk of long-term need for a catheter is very low. There is also a risk of worsening of overactive bladder.   Prolapse (with or without mesh): Risk factors for surgical failure  include things that put pressure on your pelvis and the surgical repair, including obesity, chronic cough, and heavy lifting or straining (including lifting children or adults, straining on the toilet, or lifting heavy objects such as furniture or anything weighing >25 lbs. Risks of recurrence is 20-30% with vaginal native tissue repair and a less than 10% with sacrocolpopexy with mesh.    - For preop Visit:  She is required to have a visit within 30 days of her surgery.   - Medical clearance: not required  - Anticoagulant use: No - Medicaid Hysterectomy form: No - Accepts blood transfusion: Yes - Expected length of stay: outpatient  Request sent for surgery scheduling.   [name], MD

## 2022-09-26 ENCOUNTER — Encounter: Payer: Self-pay | Admitting: Family Medicine

## 2022-09-26 ENCOUNTER — Ambulatory Visit (INDEPENDENT_AMBULATORY_CARE_PROVIDER_SITE_OTHER): Payer: Commercial Managed Care - PPO | Admitting: Family Medicine

## 2022-09-26 ENCOUNTER — Other Ambulatory Visit (HOSPITAL_BASED_OUTPATIENT_CLINIC_OR_DEPARTMENT_OTHER): Payer: Self-pay

## 2022-09-26 VITALS — BP 120/76 | HR 67 | Temp 98.5°F | Ht 64.5 in | Wt 180.5 lb

## 2022-09-26 DIAGNOSIS — J4520 Mild intermittent asthma, uncomplicated: Secondary | ICD-10-CM

## 2022-09-26 DIAGNOSIS — Z0000 Encounter for general adult medical examination without abnormal findings: Secondary | ICD-10-CM

## 2022-09-26 LAB — CBC
HCT: 44.2 % (ref 36.0–46.0)
Hemoglobin: 14.4 g/dL (ref 12.0–15.0)
MCHC: 32.5 g/dL (ref 30.0–36.0)
MCV: 93.8 fl (ref 78.0–100.0)
Platelets: 164 10*3/uL (ref 150.0–400.0)
RBC: 4.71 Mil/uL (ref 3.87–5.11)
RDW: 14.1 % (ref 11.5–15.5)
WBC: 5.3 10*3/uL (ref 4.0–10.5)

## 2022-09-26 LAB — COMPREHENSIVE METABOLIC PANEL
ALT: 26 U/L (ref 0–35)
AST: 22 U/L (ref 0–37)
Albumin: 4.1 g/dL (ref 3.5–5.2)
Alkaline Phosphatase: 63 U/L (ref 39–117)
BUN: 28 mg/dL — ABNORMAL HIGH (ref 6–23)
CO2: 27 mEq/L (ref 19–32)
Calcium: 9.1 mg/dL (ref 8.4–10.5)
Chloride: 105 mEq/L (ref 96–112)
Creatinine, Ser: 0.69 mg/dL (ref 0.40–1.20)
GFR: 93.41 mL/min (ref >60.00)
Glucose, Bld: 91 mg/dL (ref 70–99)
Potassium: 3.9 mEq/L (ref 3.5–5.1)
Sodium: 139 mEq/L (ref 135–145)
Total Bilirubin: 0.5 mg/dL (ref 0.2–1.2)
Total Protein: 6.7 g/dL (ref 6.0–8.3)

## 2022-09-26 LAB — LIPID PANEL
Cholesterol: 215 mg/dL — ABNORMAL HIGH (ref 0–200)
HDL: 80.7 mg/dL (ref >39.00)
LDL Cholesterol: 124 mg/dL — ABNORMAL HIGH (ref 0–99)
NonHDL: 134.54
Total CHOL/HDL Ratio: 3
Triglycerides: 53 mg/dL (ref 0.0–149.0)
VLDL: 10.6 mg/dL (ref 0.0–40.0)

## 2022-09-26 MED ORDER — MONTELUKAST SODIUM 10 MG PO TABS
10.0000 mg | ORAL_TABLET | Freq: Every day | ORAL | 3 refills | Status: AC
Start: 2022-09-26 — End: ?
  Filled 2022-09-26: qty 30, 30d supply, fill #0
  Filled 2023-04-06: qty 30, 30d supply, fill #1
  Filled 2023-06-02: qty 30, 30d supply, fill #2

## 2022-09-26 MED ORDER — METHYLPREDNISOLONE ACETATE 80 MG/ML IJ SUSP
80.0000 mg | Freq: Once | INTRAMUSCULAR | Status: AC
Start: 2022-09-26 — End: 2022-09-26
  Administered 2022-09-26: 80 mg via INTRAMUSCULAR

## 2022-09-26 MED ORDER — ALBUTEROL SULFATE HFA 108 (90 BASE) MCG/ACT IN AERS
2.0000 | INHALATION_SPRAY | RESPIRATORY_TRACT | 2 refills | Status: DC | PRN
  Filled 2022-09-26: qty 6.7, 17d supply, fill #0
  Filled 2022-12-13: qty 6.7, 17d supply, fill #1
  Filled 2023-04-06: qty 6.7, 17d supply, fill #2

## 2022-09-26 MED ORDER — FLUTICASONE PROPIONATE 50 MCG/ACT NA SUSP
1.0000 | Freq: Two times a day (BID) | NASAL | 10 refills | Status: AC
  Filled 2022-09-26: qty 16, 30d supply, fill #0
  Filled 2022-10-21: qty 16, 30d supply, fill #1
  Filled 2023-02-17: qty 16, 30d supply, fill #2
  Filled 2023-04-06: qty 16, 30d supply, fill #3
  Filled 2023-06-02: qty 16, 30d supply, fill #4
  Filled 2023-07-08: qty 16, 30d supply, fill #5

## 2022-09-26 NOTE — Patient Instructions (Signed)
Give [place] 2-3 business days to get the results of your labs back.   Keep the diet clean and stay active.  I recommend getting the flu shot in mid October. This suggestion would change if the CDC comes out with a different recommendation.   Please get me a copy of your advanced directive form at your convenience.   Let us know if you need anything.

## 2022-09-26 NOTE — Progress Notes (Signed)
Chief Complaint  Patient presents with   Annual Exam     Well Woman April Stone is here for a complete physical.   Her last physical was >1 year ago.  Current diet: in general, a "healthy" diet. Current exercise: kayaking, walking, wt resistance. Weight is stable and she denies fatigue out of ordinary. Patient's last menstrual period was 08/01/2013.  Seatbelt? Yes Advanced directive? No  Health Maintenance Pap/HPV- Yes Mammogram- Yes Colon cancer screening-Yes Shingrix- Yes Tetanus- Yes Hep C screening- Yes HIV screening- Yes  Over the last 3 weeks, the patient has been having increased wheezing felt in the center of her chest.  She has a history of asthma.  She has been using her albuterol inhaler with temporary relief.  She has also been using Mucinex which also provides temporary relief.  The patient has been going to the beach and cleaning out a camper she just bought.  She has been noticing the former owner was probably a smoker.  She has been using Zyrtec and Flonase for allergies.  Ragweed season did start recently as well.  Past Medical History:  Diagnosis Date   Anemia    Asthma    Thyroid cancer (HCC)      Past Surgical History:  Procedure Laterality Date   APPENDECTOMY     BUNIONECTOMY     THYROIDECTOMY     TONSILLECTOMY      Medications  Current Outpatient Medications on File Prior to Visit  Medication Sig Dispense Refill   albuterol (VENTOLIN HFA) 108 (90 Base) MCG/ACT inhaler Inhale 2 puffs by mouth  into the lungs every 4 (four) hours as needed for wheezing or shortness of breath. 2 puffs before exercise to prevent wheezing 6.7 g 2   Cholecalciferol (VITAMIN D) 1000 UNITS capsule Take 2,000 Units by mouth daily.     estradiol (ESTRACE) 0.1 MG/GM vaginal cream Place 0.5 grams vaginally every night for two weeks, then twice a week after. 42.5 g 11   fluticasone (FLONASE) 50 MCG/ACT nasal spray 1 or 2 sprays each nostril twice a day 16 g 2   ibuprofen  (ADVIL) 200 MG tablet Take 200 mg by mouth every 6 (six) hours as needed.     SYNTHROID 125 MCG tablet Take 1 tablet (125 mcg total) by mouth daily. 90 tablet 3    Allergies No Known Allergies  Review of Systems: Constitutional:  no unexpected weight changes Eye:  no recent significant change in vision Ear/Nose/Mouth/Throat:  Ears:  no recent change in hearing Nose/Mouth/Throat:  no complaints of nasal congestion, no sore throat Cardiovascular: no chest pain Respiratory:  no shortness of breath, + wheezing Gastrointestinal:  no abdominal pain, no change in bowel habits GU:  Female: negative for dysuria or pelvic pain Musculoskeletal/Extremities:  no pain of the joints Integumentary (Skin/Breast):  no abnormal skin lesions reported Neurologic:  no headaches Endocrine:  denies fatigue  Exam BP 120/76 (BP Location: Left Arm, Patient Position: Sitting, Cuff Size: Normal)   Pulse 67   Temp 98.5 F (36.9 C) (Oral)   Ht 5' 4.5" (1.638 m)   Wt 180 lb 8 oz (81.9 kg)   LMP 08/01/2013   SpO2 99%   BMI 30.50 kg/m  General:  well developed, well nourished, in no apparent distress Skin:  no significant moles, warts, or growths Head:  no masses, lesions, or tenderness Eyes:  pupils equal and round, sclera anicteric without injection Ears:  canals without lesions, TMs shiny without retraction, no obvious effusion,  no erythema Nose:  nares patent, mucosa normal, and no drainage  Throat/Pharynx:  lips and gingiva without lesion; tongue and uvula midline; non-inflamed pharynx; no exudates or postnasal drainage Neck: neck supple without adenopathy, thyromegaly, or masses Lungs: No accessory muscle use, + wheezing heard anteriorly Cardio:  regular rate and rhythm, no LE edema Abdomen:  abdomen soft, nontender; bowel sounds normal; no masses or organomegaly Genital: Defer to GYN Musculoskeletal:  symmetrical muscle groups noted without atrophy or deformity Extremities:  no clubbing, cyanosis,  or edema, no deformities, no skin discoloration Neuro:  gait normal; deep tendon reflexes normal and symmetric Psych: well oriented with normal range of affect and appropriate judgment/insight  Assessment and Plan  Well adult exam - Plan: CBC, Comprehensive metabolic panel, Lipid panel  Mild intermittent asthma without complication - Plan: montelukast (SINGULAIR) 10 MG tablet, albuterol (VENTOLIN HFA) 108 (90 Base) MCG/ACT inhaler, methylPREDNISolone acetate (DEPO-MEDROL) injection 80 mg   Well 62 y.o. female. Counseled on diet and exercise. Advanced directive form provided today.  Asthma: Exacerbation of chronic issue.  Depo injection today.  Refill albuterol.  Singulair 10 mg daily as needed.  Send message if not improving.  Could be from either ragweed starting or exposure from her care of the camper. Other orders as above. Follow up in 1 yr. The patient voiced understanding and agreement to the plan.  April Roche [place], DO 09/26/22 7:30 AM

## 2022-10-31 ENCOUNTER — Telehealth: Payer: Commercial Managed Care - PPO | Admitting: Physician Assistant

## 2022-10-31 ENCOUNTER — Other Ambulatory Visit (HOSPITAL_BASED_OUTPATIENT_CLINIC_OR_DEPARTMENT_OTHER): Payer: Self-pay

## 2022-10-31 DIAGNOSIS — H109 Unspecified conjunctivitis: Secondary | ICD-10-CM

## 2022-10-31 DIAGNOSIS — B9689 Other specified bacterial agents as the cause of diseases classified elsewhere: Secondary | ICD-10-CM

## 2022-10-31 MED ORDER — MOXIFLOXACIN HCL 0.5 % OP SOLN
1.0000 [drp] | Freq: Three times a day (TID) | OPHTHALMIC | 0 refills | Status: DC
Start: 2022-10-31 — End: 2022-11-27
  Filled 2022-10-31: qty 3, 20d supply, fill #0

## 2022-10-31 NOTE — Patient Instructions (Signed)
April Stone, thank you for joining Margaretann Loveless, PA-C for today's virtual visit.  While this provider is not your primary care provider (PCP), if your PCP is located in our provider database this encounter information will be shared with them immediately following your visit.   A [facility] MyChart account gives you access to today's visit and all your visits, tests, and labs performed at [facility] " click here if you don't have a [name] MyChart account or go to [email or web address]  Consent: (Patient) April Stone provided verbal consent for this virtual visit at the beginning of the encounter.  Current Medications:  Current Outpatient Medications:    albuterol (VENTOLIN HFA) 108 (90 Base) MCG/ACT inhaler, Inhale 2 puffs by mouth  into the lungs every 4 (four) hours as needed for wheezing or shortness of breath. 2 puffs before exercise to prevent wheezing, Disp: 6.7 g, Rfl: 2   Cholecalciferol (VITAMIN D) 1000 UNITS capsule, Take 2,000 Units by mouth daily., Disp: , Rfl:    estradiol (ESTRACE) 0.1 MG/GM vaginal cream, Place 0.5 grams vaginally every night for two weeks, then twice a week after., Disp: 42.5 g, Rfl: 11   fluticasone (FLONASE) 50 MCG/ACT nasal spray, Place 1-2 sprays into both nostrils 2 (two) times daily., Disp: 16 g, Rfl: 10   montelukast (SINGULAIR) 10 MG tablet, Take 1 tablet (10 mg total) by mouth at bedtime., Disp: 30 tablet, Rfl: 3   moxifloxacin (VIGAMOX) 0.5 % ophthalmic solution, Place 1 drop into both eyes 3 (three) times daily. For 5 days, Disp: 3 mL, Rfl: 0   SYNTHROID 125 MCG tablet, Take 1 tablet (125 mcg total) by mouth daily., Disp: 90 tablet, Rfl: 3   Medications ordered in this encounter:  Meds ordered this encounter  Medications   moxifloxacin (VIGAMOX) 0.5 % ophthalmic solution    Sig: Place 1 drop into both eyes 3 (three) times daily. For 5 days    Dispense:  3 mL    Refill:  0    Order Specific Question:    Supervising Provider    Answer:   Merrilee Jansky [[id number]]     *If you need refills on other medications prior to your next appointment, please contact your pharmacy*  Follow-Up: Call back or seek an in-person evaluation if the symptoms worsen or if the condition fails to improve as anticipated.  [facility] [phone]  Other Instructions Bacterial Conjunctivitis, Adult Bacterial conjunctivitis is an infection of the clear membrane that covers the white part of the eye and the inner surface of the eyelid (conjunctiva). When the blood vessels in the conjunctiva become inflamed, the eye becomes red or pink. The eye often feels irritated or itchy. Bacterial conjunctivitis spreads easily from person to person (is contagious). It also spreads easily from one eye to the other eye. What are the causes? This condition is caused by bacteria. You may get the infection if you come into close contact with: A person who is infected with the bacteria. Items that are contaminated with the bacteria, such as a face towel, contact lens solution, or eye makeup. What increases the risk? You are more likely to develop this condition if: You are exposed to other people who have the infection. You wear contact lenses. You have a sinus infection. You have had a recent eye injury or surgery. You have a weak body defense system (immune system). You have a medical condition that causes dry eyes. What are the signs  or symptoms? Symptoms of this condition include: Thick, yellowish discharge from the eye. This may turn into a crust on the eyelid overnight and cause your eyelids to stick together. Tearing or watery eyes. Itchy eyes. Burning feeling in your eyes. Eye redness. Swollen eyelids. Blurred vision. How is this diagnosed? This condition is diagnosed based on your symptoms and medical history. Your health care provider may also take a sample of discharge from your eye to find the  cause of your infection. How is this treated? This condition may be treated with: Antibiotic eye drops or ointment to clear the infection more quickly and prevent the spread of infection to others. Antibiotic medicines taken by mouth (orally) to treat infections that do not respond to drops or ointments or that last longer than 10 days. Cool, wet cloths (cool compresses) placed on the eyes. Artificial tears applied 2-6 times a day. Follow these instructions at home: Medicines Take or apply your antibiotic medicine as told by your health care provider. Do not stop using the antibiotic, even if your condition improves, unless directed by your health care provider. Take or apply over-the-counter and prescription medicines only as told by your health care provider. Be very careful to avoid touching the edge of your eyelid with the eye-drop bottle or the ointment tube when you apply medicines to the affected eye. This will keep you from spreading the infection to your other eye or to other people. Managing discomfort Gently wipe away any drainage from your eye with a warm, wet washcloth or a cotton ball. Apply a clean, cool compress to your eye for 10-20 minutes, 3-4 times a day. General instructions Do not wear contact lenses until the inflammation is gone and your health care provider says it is safe to wear them again. Ask your health care provider how to sterilize or replace your contact lenses before you use them again. Wear glasses until you can resume wearing contact lenses. Avoid wearing eye makeup until the inflammation is gone. Throw away any old eye cosmetics that may be contaminated. Change or wash your pillowcase every day. Do not share towels or washcloths. This may spread the infection. Wash your hands often with soap and water for at least 20 seconds and especially before touching your face or eyes. Use paper towels to dry your hands. Avoid touching or rubbing your eyes. Do not drive  or use heavy machinery if your vision is blurred. Contact a health care provider if: You have a fever. Your symptoms do not get better after 10 days. Get help right away if: You have a fever and your symptoms suddenly get worse. You have severe pain when you move your eye. You have facial pain, redness, or swelling. You have a sudden loss of vision. Summary Bacterial conjunctivitis is an infection of the clear membrane that covers the white part of the eye and the inner surface of the eyelid (conjunctiva). Bacterial conjunctivitis spreads easily from eye to eye and from person to person (is contagious). Wash your hands often with soap and water for at least 20 seconds and especially before touching your face or eyes. Use paper towels to dry your hands. Take or apply your antibiotic medicine as told by your health care provider. Do not stop using the antibiotic even if your condition improves. Contact a health care provider if you have a fever or if your symptoms do not get better after 10 days. Get help right away if you have a sudden loss of  vision. This information is not intended to replace advice given to you by your health care provider. Make sure you discuss any questions you have with your health care provider. Document Revised: 04/25/2020 Document Reviewed: 04/25/2020 Elsevier Patient Education  2024 Elsevier Inc.    If you have been instructed to have an in-person evaluation today at a local Urgent Care facility, please use the link below. It will take you to a list of all of our available [facility], including address, phone number and hours of operation. Please do not delay care.  [facility]  If you or a family member do not have a primary care provider, use the link below to schedule a visit and establish care. When you choose a [facility] primary care physician or advanced practice provider, you gain a long-term partner in health. Find a Primary  Care Provider  Learn more about [facility]'s in-office and virtual care options: [facility] - Get Care Now

## 2022-10-31 NOTE — Progress Notes (Signed)
Virtual Visit Consent   April Stone, you are scheduled for a virtual visit with a [facility] provider today. Just as with appointments in the office, your consent must be obtained to participate. Your consent will be active for this visit and any virtual visit you may have with one of our providers in the next 365 days. If you have a MyChart account, a copy of this consent can be sent to you electronically.  As this is a virtual visit, video technology does not allow for your provider to perform a traditional examination. This may limit your provider's ability to fully assess your condition. If your provider identifies any concerns that need to be evaluated in person or the need to arrange testing (such as labs, EKG, etc.), we will make arrangements to do so. Although advances in technology are sophisticated, we cannot ensure that it will always work on either your end or our end. If the connection with a video visit is poor, the visit may have to be switched to a telephone visit. With either a video or telephone visit, we are not always able to ensure that we have a secure connection.  By engaging in this virtual visit, you consent to the provision of healthcare and authorize for your insurance to be billed (if applicable) for the services provided during this visit. Depending on your insurance coverage, you may receive a charge related to this service.  I need to obtain your verbal consent now. Are you willing to proceed with your visit today? April Stone has provided verbal consent on 10/31/2022 for a virtual visit (video or telephone). April Loveless, PA-C  Date: 10/31/2022 8:50 AM  Virtual Visit via Video Note   I, April Stone, connected with  April Stone  ([id number], Dec 27, 1960) on 10/31/22 at  8:45 AM EDT by a video-enabled telemedicine application and verified that I am speaking with the correct person using two identifiers.  Location: Patient: Virtual Visit  Location Patient: Home Provider: Virtual Visit Location Provider: Home Office   I discussed the limitations of evaluation and management by telemedicine and the availability of in person appointments. The patient expressed understanding and agreed to proceed.    History of Present Illness: April Stone is a 62 y.o. who identifies as a female who was assigned female at birth, and is being seen today for possible pink eye.  HPI: Conjunctivitis  The current episode started 2 days ago (Had been itching for a couple of days prior then had crusty discharge). The onset was sudden. The problem occurs continuously. The problem has been unchanged. The problem is mild. Nothing relieves the symptoms. Nothing aggravates the symptoms. Associated symptoms include eye itching, photophobia (mild), eye discharge and eye redness. Pertinent negatives include no fever, no decreased vision, no double vision, no congestion, no ear discharge, no headaches, no rhinorrhea, no URI and no eye pain. The eye pain is mild. Both eyes are affected. The eye pain is not associated with movement. The eyelid exhibits no abnormality.     Problems:  Patient Active Problem List   Diagnosis Date Noted   Preventative health care 03/15/2014   Onychomycosis 02/02/2014   Anemia    THYROID CANCER, HX OF 12/05/2009   Asthma 02/18/2008    Allergies: No Known Allergies Medications:  Current Outpatient Medications:    albuterol (VENTOLIN HFA) 108 (90 Base) MCG/ACT inhaler, Inhale 2 puffs by mouth  into the lungs every 4 (four) hours as needed for wheezing  or shortness of breath. 2 puffs before exercise to prevent wheezing, Disp: 6.7 g, Rfl: 2   Cholecalciferol (VITAMIN D) 1000 UNITS capsule, Take 2,000 Units by mouth daily., Disp: , Rfl:    estradiol (ESTRACE) 0.1 MG/GM vaginal cream, Place 0.5 grams vaginally every night for two weeks, then twice a week after., Disp: 42.5 g, Rfl: 11   fluticasone (FLONASE) 50 MCG/ACT nasal spray,  Place 1-2 sprays into both nostrils 2 (two) times daily., Disp: 16 g, Rfl: 10   montelukast (SINGULAIR) 10 MG tablet, Take 1 tablet (10 mg total) by mouth at bedtime., Disp: 30 tablet, Rfl: 3   moxifloxacin (VIGAMOX) 0.5 % ophthalmic solution, Place 1 drop into both eyes 3 (three) times daily. For 5 days, Disp: 3 mL, Rfl: 0   SYNTHROID 125 MCG tablet, Take 1 tablet (125 mcg total) by mouth daily., Disp: 90 tablet, Rfl: 3  Observations/Objective: Patient is well-developed, well-nourished in no acute distress.  Resting comfortably at home.  Head is normocephalic, atraumatic.  No labored breathing.  Speech is clear and coherent with logical content.  Patient is alert and oriented at baseline.  Bilateral eyes are injected  Assessment and Plan: 1. Bacterial conjunctivitis of both eyes - moxifloxacin (VIGAMOX) 0.5 % ophthalmic solution; Place 1 drop into both eyes 3 (three) times daily. For 5 days  Dispense: 3 mL; Refill: 0  - Suspect bacterial conjunctivitis - Vigamox prescribed - Warm compresses - Good hand hygiene - Seek in person evaluation if symptoms worsen or fail to improve   Follow Up Instructions: I discussed the assessment and treatment plan with the patient. The patient was provided an opportunity to ask questions and all were answered. The patient agreed with the plan and demonstrated an understanding of the instructions.  A copy of instructions were sent to the patient via MyChart unless otherwise noted below.    The patient was advised to call back or seek an in-person evaluation if the symptoms worsen or if the condition fails to improve as anticipated.   April Loveless, PA-C

## 2022-11-27 ENCOUNTER — Ambulatory Visit: Payer: Commercial Managed Care - PPO | Admitting: Obstetrics and Gynecology

## 2022-11-27 ENCOUNTER — Encounter: Payer: Self-pay | Admitting: Obstetrics and Gynecology

## 2022-11-27 ENCOUNTER — Other Ambulatory Visit (HOSPITAL_BASED_OUTPATIENT_CLINIC_OR_DEPARTMENT_OTHER): Payer: Self-pay

## 2022-11-27 VITALS — BP 143/85 | HR 85 | Ht 62.8 in | Wt 185.2 lb

## 2022-11-27 DIAGNOSIS — Z01818 Encounter for other preprocedural examination: Secondary | ICD-10-CM

## 2022-11-27 MED ORDER — OXYCODONE HCL 5 MG PO TABS
5.0000 mg | ORAL_TABLET | ORAL | 0 refills | Status: DC | PRN
Start: 2022-11-27 — End: 2023-01-30
  Filled 2022-11-27: qty 15, 3d supply, fill #0

## 2022-11-27 MED ORDER — ACETAMINOPHEN 500 MG PO TABS
500.0000 mg | ORAL_TABLET | Freq: Four times a day (QID) | ORAL | 0 refills | Status: AC | PRN
Start: 2022-11-27 — End: ?
  Filled 2022-11-27: qty 100, 25d supply, fill #0

## 2022-11-27 MED ORDER — IBUPROFEN 600 MG PO TABS
600.0000 mg | ORAL_TABLET | Freq: Four times a day (QID) | ORAL | 0 refills | Status: DC | PRN
Start: 2022-11-27 — End: 2023-06-03
  Filled 2022-11-27: qty 30, 8d supply, fill #0

## 2022-11-27 MED ORDER — ONDANSETRON HCL 4 MG PO TABS
4.0000 mg | ORAL_TABLET | Freq: Three times a day (TID) | ORAL | 0 refills | Status: DC | PRN
Start: 2022-11-27 — End: 2023-01-30
  Filled 2022-11-27: qty 10, 4d supply, fill #0

## 2022-11-27 MED ORDER — POLYETHYLENE GLYCOL 3350 17 GM/SCOOP PO POWD
17.0000 g | Freq: Every day | ORAL | 0 refills | Status: DC
Start: 2022-11-27 — End: 2023-09-30
  Filled 2022-11-27: qty 238, 14d supply, fill #0

## 2022-11-27 NOTE — H&P (Signed)
[facility] H&P  Subjective Chief Complaint: April Stone presents for a preoperative encounter.   History of Present Illness: April Stone is a 62 y.o. female who presents for preoperative visit.  She is scheduled to undergo Exam under anesthesia, robotic assisted total laparoscopic hysterectomy with bilateral salpingectomy, sacrocolpopexy, perineorrhaphy, cystoscopy  on 12/17/22.  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   Urodynamics showed: 1. Sensation was increased; capacity was normal 2. Stress Incontinence was not demonstrated at normal pressures; 3. Detrusor Overactivity was demonstrated without leakage. 4. Emptying was dysfunctional with a mildly elevated PVR for the micturition study ( ) and elevated on the initial uroflow ( ), a sustained detrusor contraction present,  abdominal straining not present, normal urethral sphincter activity on EMG.  Past Medical History:  Diagnosis Date   Anemia    Asthma    Thyroid cancer (HCC)      Past Surgical History:  Procedure Laterality Date   APPENDECTOMY     BUNIONECTOMY     THYROIDECTOMY     TONSILLECTOMY      has No Known Allergies.   Family History  Problem Relation Age of Onset   Thyroid disease Mother    Heart disease Father    Hypertension Father    Bladder Cancer Father    Atrial fibrillation Maternal Grandmother     Social History   Tobacco Use   Smoking status: Never   Smokeless tobacco: Never  Vaping Use   Vaping status: Never Used  Substance Use Topics   Alcohol use: Yes    Alcohol/week: 0.0 standard drinks of alcohol    Comment: rarely   Drug use: No     Review of Systems was negative for a full 10 system review except as noted in the History of Present Illness.  No current facility-administered medications for this encounter.  Current Outpatient Medications:     acetaminophen (TYLENOL) 500 MG tablet, Take 1 tablet (500 mg total) by mouth every 6 (six) hours as needed (pain)., Disp: 100 tablet, Rfl: 0   albuterol (VENTOLIN HFA) 108 (90 Base) MCG/ACT inhaler, Inhale 2 puffs by mouth  into the lungs every 4 (four) hours as needed for wheezing or shortness of breath. 2 puffs before exercise to prevent wheezing, Disp: 6.7 g, Rfl: 2   Cholecalciferol (VITAMIN D) 1000 UNITS capsule, Take 2,000 Units by mouth daily., Disp: , Rfl:    estradiol (ESTRACE) 0.1 MG/GM vaginal cream, Place 0.5 grams vaginally every night for two weeks, then twice a week after., Disp: 42.5 g, Rfl: 11   fluticasone (FLONASE) 50 MCG/ACT nasal spray, Place 1-2 sprays into both nostrils 2 (two) times daily., Disp: 16 g, Rfl: 10   ibuprofen (ADVIL) 600 MG tablet, Take 1 tablet (600 mg total) by mouth every 6 (six) hours as needed., Disp: 30 tablet, Rfl: 0   montelukast (SINGULAIR) 10 MG tablet, Take 1 tablet (10 mg total) by mouth at bedtime., Disp: 30 tablet, Rfl: 3   ondansetron (ZOFRAN) 4 MG tablet, Take 1 tablet (4 mg total) by mouth every 8 (eight) hours as needed for nausea or vomiting., Disp: 10 tablet, Rfl: 0   oxyCODONE (OXY IR/ROXICODONE) 5 MG immediate release tablet, Take 1 tablet (5 mg total) by mouth every 4 (four) hours as needed for severe pain (pain score 7-10)., Disp: 15 tablet, Rfl: 0   polyethylene glycol powder (GLYCOLAX/MIRALAX) 17 GM/SCOOP powder, Drink  17 grams (1 scoop) dissolved in water/liquid daily., Disp: 238 g, Rfl: 0   SYNTHROID 125 MCG tablet, Take 1 tablet (125 mcg total) by mouth daily., Disp: 90 tablet, Rfl: 3   Objective There were no vitals filed for this visit.   Gen: NAD CV: S1 S2 RRR Lungs: Clear to auscultation bilaterally Abd: soft, nontender   Previous Pelvic Exam showed: Normal external genitalia. On speculum, normal vaginal mucosa and normal appearing cervix. On bimanual, uterus is small, mobile and nontender.      POP-Q   0.5                                             Aa   0.5                                           Ba   -4.5                                              C    5.5                                            Gh   4                                            Pb   9                                            tvl    0                                            Ap   0                                            Bp   -8       Assessment/ Plan  Assessment: The patient is a 62 y.o. year old scheduled to undergo Exam under anesthesia, robotic assisted total laparoscopic hysterectomy with bilateral salpingectomy, sacrocolpopexy, perineorrhaphy, cystoscopy. Verbal consent was obtained for these procedures.

## 2022-11-27 NOTE — Progress Notes (Signed)
[facility] Pre-Operative Exam  Subjective Chief Complaint: April Stone presents for a preoperative encounter.   History of Present Illness: April Stone is a 62 y.o. female who presents for preoperative visit.  She is scheduled to undergo Exam under anesthesia, robotic assisted total laparoscopic hysterectomy with bilateral salpingectomy, sacrocolpopexy, perineorrhaphy, cystoscopy  on 12/17/22.  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   Urodynamics showed: 1. Sensation was increased; capacity was normal 2. Stress Incontinence was not demonstrated at normal pressures; 3. Detrusor Overactivity was demonstrated without leakage. 4. Emptying was dysfunctional with a mildly elevated PVR for the micturition study ( ) and elevated on the initial uroflow ( ), a sustained detrusor contraction present,  abdominal straining not present, normal urethral sphincter activity on EMG.  Past Medical History:  Diagnosis Date   Anemia    Asthma    Thyroid cancer (HCC)      Past Surgical History:  Procedure Laterality Date   APPENDECTOMY     BUNIONECTOMY     THYROIDECTOMY     TONSILLECTOMY      has No Known Allergies.   Family History  Problem Relation Age of Onset   Thyroid disease Mother    Heart disease Father    Hypertension Father    Bladder Cancer Father    Atrial fibrillation Maternal Grandmother     Social History   Tobacco Use   Smoking status: Never   Smokeless tobacco: Never  Vaping Use   Vaping status: Never Used  Substance Use Topics   Alcohol use: Yes    Alcohol/week: 0.0 standard drinks of alcohol    Comment: rarely   Drug use: No     Review of Systems was negative for a full 10 system review except as noted in the History of Present Illness.   Current Outpatient Medications:    albuterol (VENTOLIN HFA) 108 (90 Base) MCG/ACT inhaler,  Inhale 2 puffs by mouth  into the lungs every 4 (four) hours as needed for wheezing or shortness of breath. 2 puffs before exercise to prevent wheezing, Disp: 6.7 g, Rfl: 2   Cholecalciferol (VITAMIN D) 1000 UNITS capsule, Take 2,000 Units by mouth daily., Disp: , Rfl:    estradiol (ESTRACE) 0.1 MG/GM vaginal cream, Place 0.5 grams vaginally every night for two weeks, then twice a week after., Disp: 42.5 g, Rfl: 11   fluticasone (FLONASE) 50 MCG/ACT nasal spray, Place 1-2 sprays into both nostrils 2 (two) times daily., Disp: 16 g, Rfl: 10   montelukast (SINGULAIR) 10 MG tablet, Take 1 tablet (10 mg total) by mouth at bedtime., Disp: 30 tablet, Rfl: 3   SYNTHROID 125 MCG tablet, Take 1 tablet (125 mcg total) by mouth daily., Disp: 90 tablet, Rfl: 3   Objective Vitals:   11/27/22 0759  BP: (!) 143/85  Pulse: 85    Gen: NAD CV: S1 S2 RRR Lungs: Clear to auscultation bilaterally Abd: soft, nontender   Previous Pelvic Exam showed: Normal external genitalia. On speculum, normal vaginal mucosa and normal appearing cervix. On bimanual, uterus is small, mobile and nontender.      POP-Q   0.5                                            Aa   0.5  Ba   -4.5                                              C    5.5                                            Gh   4                                            Pb   9                                            tvl    0                                            Ap   0                                            Bp   -8       Assessment/ Plan  Assessment: The patient is a 62 y.o. year old scheduled to undergo Exam under anesthesia, robotic assisted total laparoscopic hysterectomy with bilateral salpingectomy, sacrocolpopexy, perineorrhaphy, cystoscopy. Verbal consent was obtained for these procedures.  Plan: General Surgical Consent: The patient has previously been counseled on alternative  treatments, and the decision by the patient and provider was to proceed with the procedure listed above.  For all procedures, there are risks of bleeding, infection, damage to surrounding organs including but not limited to bowel, bladder, blood vessels, ureters and nerves, and need for further surgery if an injury were to occur. These risks are all low with minimally invasive surgery.   There are risks of numbness and weakness at any body site or buttock/rectal pain.  It is possible that baseline pain can be worsened by surgery, either with or without mesh. If surgery is vaginal, there is also a low risk of possible conversion to laparoscopy or open abdominal incision where indicated. Very rare risks include blood transfusion, blood clot, heart attack, pneumonia, or death.   There is also a risk of short-term postoperative urinary retention with need to use a catheter. About half of patients need to go home from surgery with a catheter, which is then later removed in the office. The risk of long-term need for a catheter is very low. There is also a risk of worsening of overactive bladder.      Prolapse (with or without mesh): Risk factors for surgical failure  include things that put pressure on your pelvis and the surgical repair, including obesity, chronic cough, and heavy lifting or straining (including lifting children or adults, straining on the toilet, or lifting heavy objects such as furniture or anything weighing >25 lbs. Risks of recurrence is 20-30% with  vaginal native tissue repair and a less than 10% with sacrocolpopexy with mesh.    Sacrocolpopexy: Mesh implants may provide more prolapse support, but do have some unique risks to consider. It is important to understand that mesh is permanent and cannot be easily removed. Risks of abdominal sacrocolpopexy mesh include mesh exposure (~3-6%), painful intercourse (recent studies show lower rates after surgery compared to before, with ~5-8%  risk of new onset), and very rare risks of bowel or bladder injury or infection (<1%). The risk of mesh exposure is more likely in a woman with risks for poor healing (prior radiation, poorly controlled diabetes, or immunocompromised). The risk of new or worsened chronic pain after mesh implant is more common in women with baseline chronic pain and/or poorly controlled anxiety or depression. There is an FDA safety notification on vaginal mesh procedures for prolapse but NOT abdominal mesh procedures and therefore does not apply to your surgery. We have extensive experience and training with mesh placement and we have close postoperative follow up to identify any potential complications from mesh.    We discussed consent for blood products. Risks for blood transfusion include allergic reactions, other reactions that can affect different body organs and managed accordingly, transmission of infectious diseases such as HIV or Hepatitis. However, the blood is screened. Patient consents for blood products.  Pre-operative instructions:  She was instructed to not take Aspirin/NSAIDs x 7days prior to surgery. Antibiotic prophylaxis was ordered as indicated.  Catheter use: Patient will go home with foley if needed after post-operative voiding trial.  Post-operative instructions:  She was provided with specific post-operative instructions, including precautions and signs/symptoms for which we would recommend contacting us, in addition to daytime and after-hours contact phone numbers. This was provided on a handout.   Post-operative medications: Prescriptions for motrin, tylenol, miralax, and oxycodone were sent to her pharmacy. Discussed using ibuprofen and tylenol on a schedule to limit use of narcotics.   Laboratory testing:  We will check labs: CBC and Type and screen as she is having a hysterectomy.   Preoperative clearance:  She does not require surgical clearance.    Post-operative follow-up:  A  post-operative appointment will be made for 6 weeks from the date of surgery. If she needs a post-operative nurse visit for a voiding trial, that will be set up after she leaves the hospital.    Patient will call the clinic or use MyChart should anything change or any new issues arise.   Selmer Dominion, NP

## 2022-12-11 ENCOUNTER — Encounter (HOSPITAL_BASED_OUTPATIENT_CLINIC_OR_DEPARTMENT_OTHER): Payer: Self-pay | Admitting: Obstetrics and Gynecology

## 2022-12-11 NOTE — Progress Notes (Signed)
Your procedure is scheduled on Wednesday, 12/17/2022.  Report to [facility] AT  6:30 AM.   Call this number if you have problems the morning of surgery  [phone].   OUR ADDRESS IS [address].  WE ARE LOCATED IN [facility].  PLEASE BRING YOUR INSURANCE CARD AND PHOTO ID DAY OF SURGERY.  ONLY 2 PEOPLE ARE ALLOWED IN  WAITING  ROOM                                      REMEMBER:  DO NOT EAT FOOD, CANDY GUM OR MINTS  AFTER MIDNIGHT THE NIGHT BEFORE YOUR SURGERY . YOU MAY HAVE CLEAR LIQUIDS FROM MIDNIGHT THE NIGHT BEFORE YOUR SURGERY UNTIL  5:30 AM. NO CLEAR LIQUIDS AFTER   5:30 AM DAY OF SURGERY.  YOU MAY  BRUSH YOUR TEETH MORNING OF SURGERY AND RINSE YOUR MOUTH OUT, NO CHEWING GUM CANDY OR MINTS.     CLEAR LIQUID DIET    Allowed      Water                                                                   Coffee and tea, regular and decaf  (NO cream or milk products of any type, may sweeten)                         Carbonated beverages, regular and diet                                    Sports drinks like Gatorade _____________________________________________________________________     TAKE ONLY THESE MEDICATIONS MORNING OF SURGERY: Albuterol inhaler if needed (please bring with you on the day of surgery), Flonase, Synthroid                                        DO NOT WEAR JEWERLY/  METAL/  PIERCINGS (INCLUDING NO PLASTIC PIERCINGS) DO NOT WEAR LOTIONS, POWDERS, PERFUMES OR NAIL POLISH ON YOUR FINGERNAILS. TOENAIL POLISH IS OK TO WEAR. DO NOT SHAVE FOR 48 HOURS PRIOR TO DAY OF SURGERY.  CONTACTS, GLASSES, OR DENTURES MAY NOT BE WORN TO SURGERY.  REMEMBER: NO SMOKING, VAPING ,  DRUGS OR ALCOHOL FOR 24 HOURS BEFORE YOUR SURGERY.                                     IS NOT RESPONSIBLE  FOR ANY BELONGINGS.                                                                    April Kitchen  [facility] - Preparing for  Surgery Before surgery, you can play an important role.  Because skin is not sterile, your skin needs to be as free of germs as possible.  You can reduce the number of germs on your skin by washing with CHG (chlorahexidine gluconate) soap before surgery.  CHG is an antiseptic cleaner which kills germs and bonds with the skin to continue killing germs even after washing. Please DO NOT use if you have an allergy to CHG or antibacterial soaps.  If your skin becomes reddened/irritated stop using the CHG and inform your nurse when you arrive at Short Stay. Do not shave (including legs and underarms) for at least 48 hours prior to the first CHG shower.  You may shave your face/neck. Please follow these instructions carefully:  1.  Shower with CHG Soap the night before surgery and the  morning of Surgery.  2.  If you choose to wash your hair, wash your hair first as usual with your  normal  shampoo.  3.  After you shampoo, rinse your hair and body thoroughly to remove the  shampoo.                                        4.  Use CHG as you would any other liquid soap.  You can apply chg directly  to the skin and wash , chg soap provided, night before and morning of your surgery.  5.  Apply the CHG Soap to your body ONLY FROM THE NECK DOWN.   Do not use on face/ open                           Wound or open sores. Avoid contact with eyes, ears mouth and genitals (private parts).                       Wash face,  Genitals (private parts) with your normal soap.             6.  Wash thoroughly, paying special attention to the area where your surgery  will be performed.  7.  Thoroughly rinse your body with warm water from the neck down.  8.  DO NOT shower/wash with your normal soap after using and rinsing off  the CHG Soap.             9.  Pat yourself dry with a clean towel.            10.  Wear clean pajamas.            11.  Place clean sheets on your bed the night of your first shower and do not  sleep with  pets. Day of Surgery : Do not apply any lotions/ powders the morning of surgery.  Please wear clean clothes to the hospital/surgery center.  IF YOU HAVE ANY SKIN IRRITATION OR PROBLEMS WITH THE SURGICAL SOAP, PLEASE GET A BAR OF GOLD DIAL SOAP AND SHOWER THE NIGHT BEFORE YOUR SURGERY AND THE MORNING OF YOUR SURGERY. PLEASE LET THE NURSE KNOW MORNING OF YOUR SURGERY IF YOU HAD ANY PROBLEMS WITH THE SURGICAL SOAP.   YOUR SURGEON MAY HAVE REQUESTED EXTENDED RECOVERY TIME AFTER YOUR SURGERY. IT COULD BE A  JUST A FEW HOURS  UP TO AN OVERNIGHT STAY.  YOUR SURGEON SHOULD HAVE  DISCUSSED THIS WITH YOU PRIOR TO YOUR SURGERY. IN THE EVENT YOU NEED TO STAY OVERNIGHT PLEASE REFER TO THE FOLLOWING GUIDELINES. YOU MAY HAVE UP TO 4 VISITORS  MAY VISIT IN THE EXTENDED RECOVERY ROOM UNTIL 800 PM ONLY.  ONE  VISITOR AGE 55 AND OVER MAY SPEND THE NIGHT AND MUST BE IN EXTENDED RECOVERY ROOM NO LATER THAN 800 PM . YOUR DISCHARGE TIME AFTER YOU SPEND THE NIGHT IS 900 AM THE MORNING AFTER YOUR SURGERY. YOU MAY PACK A SMALL OVERNIGHT BAG WITH TOILETRIES FOR YOUR OVERNIGHT STAY IF YOU WISH.  REGARDLESS OF IF YOU STAY OVER NIGHT OR ARE DISCHARGED THE SAME DAY YOU WILL BE REQUIRED TO HAVE A RESPONSIBLE ADULT (18 YRS OLD OR OLDER) STAY WITH YOU FOR AT LEAST THE FIRST 24 HOURS  YOUR PRESCRIPTION MEDICATIONS WILL BE PROVIDED DURING YOUR HOSPITAL STAY.  ________________________________________________________________________                                                        QUESTIONS April Stone PRE OP NURSE PHONE [phone].

## 2022-12-12 ENCOUNTER — Other Ambulatory Visit: Payer: Self-pay

## 2022-12-12 ENCOUNTER — Encounter (HOSPITAL_BASED_OUTPATIENT_CLINIC_OR_DEPARTMENT_OTHER): Payer: Self-pay | Admitting: Obstetrics and Gynecology

## 2022-12-12 ENCOUNTER — Encounter (HOSPITAL_COMMUNITY)
Admission: RE | Admit: 2022-12-12 | Discharge: 2022-12-12 | Disposition: A | Discharge status: Home / Self Care | Payer: Commercial Managed Care - PPO | Source: Ambulatory Visit | Attending: Obstetrics and Gynecology | Admitting: Obstetrics and Gynecology

## 2022-12-12 DIAGNOSIS — Z01812 Encounter for preprocedural laboratory examination: Secondary | ICD-10-CM | POA: Insufficient documentation

## 2022-12-12 DIAGNOSIS — Z01818 Encounter for other preprocedural examination: Secondary | ICD-10-CM

## 2022-12-12 LAB — CBC
HCT: 43.4 % (ref 36.0–46.0)
Hemoglobin: 14.1 g/dL (ref 12.0–15.0)
MCH: 31.7 pg (ref 26.0–34.0)
MCHC: 32.5 g/dL (ref 30.0–36.0)
MCV: 97.5 fL (ref 80.0–100.0)
Platelets: 153 10*3/uL (ref 150–400)
RBC: 4.45 MIL/uL (ref 3.87–5.11)
RDW: 13.8 % (ref 11.5–15.5)
WBC: 5.9 10*3/uL (ref 4.0–10.5)
nRBC: 0 % (ref 0.0–0.2)

## 2022-12-12 NOTE — Progress Notes (Signed)
Spoke w/ via phone for pre-op interview---April [name] needs dos---- none        [name] results------12/12/22 [name] appt for cbc, type & screen COVID test -----patient states asymptomatic no test needed Arrive at -------0630 on Wednesday, 12/17/2022 NPO after MN NO Solid Food.  Clear liquids from MN until---0530 Med rec completed Medications to take morning of surgery -----Albuterol inhaler prn (please bring with you), Flonase prn, Synthroid Diabetic medication -----n/a Patient instructed no nail polish to be worn day of surgery Patient instructed to bring photo id and insurance card day of surgery Patient aware to have Driver (ride ) / caregiver    for 24 hours after surgery - significant other - Curtis Patient Special Instructions -----Extended / overnight stay instructions given. Pre-Op special Instructions -----none Patient verbalized understanding of instructions that were given at this phone interview. Patient denies chest pain, sob, fever, cough at the interview.

## 2022-12-16 NOTE — Anesthesia Preprocedure Evaluation (Signed)
Anesthesia Evaluation  Patient identified by MRN, date of birth, ID band Patient awake    Reviewed: Allergy & Precautions, NPO status , Patient's Chart, lab work & pertinent test results  History of Anesthesia Complications (+) PONV and history of anesthetic complications  Airway Mallampati: III  TM Distance: <3 FB Neck ROM: Full  Mouth opening: Limited Mouth Opening  Dental  (+) Teeth Intact, Poor Dentition, Dental Advisory Given,    Pulmonary asthma    breath sounds clear to auscultation       Cardiovascular negative cardio ROS  Rhythm:Regular Rate:Normal     Neuro/Psych negative neurological ROS  negative psych ROS   GI/Hepatic negative GI ROS, Neg liver ROS,,,  Endo/Other  Hypothyroidism    Renal/GU negative Renal ROS     Musculoskeletal  (+) Arthritis ,    Abdominal   Peds  Hematology  (+) Blood dyscrasia, anemia   Anesthesia Other Findings   Reproductive/Obstetrics                             Anesthesia Physical Anesthesia Plan  ASA: 2  Anesthesia Plan: General   Post-op Pain Management: Tylenol PO (pre-op)* and Toradol IV (intra-op)*   Induction: Intravenous  PONV Risk Score and Plan: 4 or greater and Ondansetron, Dexamethasone, Midazolam, Scopolamine patch - Pre-op, TIVA and Propofol infusion  Airway Management Planned: Oral ETT  Additional Equipment: None  Intra-op Plan:   Post-operative Plan: Extubation in OR  Informed Consent: I have reviewed the patients History and Physical, chart, labs and discussed the procedure including the risks, benefits and alternatives for the proposed anesthesia with the patient or authorized representative who has indicated his/her understanding and acceptance.     Dental advisory given  Plan Discussed with: CRNA  Anesthesia Plan Comments: (- BIS Monitor)       Anesthesia Quick Evaluation

## 2022-12-17 ENCOUNTER — Ambulatory Visit (HOSPITAL_BASED_OUTPATIENT_CLINIC_OR_DEPARTMENT_OTHER): Payer: Commercial Managed Care - PPO | Admitting: Anesthesiology

## 2022-12-17 ENCOUNTER — Encounter (HOSPITAL_BASED_OUTPATIENT_CLINIC_OR_DEPARTMENT_OTHER): Payer: Self-pay | Admitting: Obstetrics and Gynecology

## 2022-12-17 ENCOUNTER — Encounter (HOSPITAL_BASED_OUTPATIENT_CLINIC_OR_DEPARTMENT_OTHER)
Admission: RE | Disposition: A | Discharge status: Home / Self Care | Payer: Self-pay | Source: Home / Self Care | Attending: Obstetrics and Gynecology

## 2022-12-17 ENCOUNTER — Other Ambulatory Visit: Payer: Self-pay

## 2022-12-17 ENCOUNTER — Ambulatory Visit (HOSPITAL_BASED_OUTPATIENT_CLINIC_OR_DEPARTMENT_OTHER)
Admission: RE | Admit: 2022-12-17 | Discharge: 2022-12-17 | Disposition: A | Discharge status: Home / Self Care | Payer: Commercial Managed Care - PPO | Attending: Obstetrics and Gynecology | Admitting: Obstetrics and Gynecology

## 2022-12-17 DIAGNOSIS — E039 Hypothyroidism, unspecified: Secondary | ICD-10-CM | POA: Diagnosis not present

## 2022-12-17 DIAGNOSIS — Z79899 Other long term (current) drug therapy: Secondary | ICD-10-CM | POA: Insufficient documentation

## 2022-12-17 DIAGNOSIS — J45909 Unspecified asthma, uncomplicated: Secondary | ICD-10-CM | POA: Insufficient documentation

## 2022-12-17 DIAGNOSIS — Z85850 Personal history of malignant neoplasm of thyroid: Secondary | ICD-10-CM | POA: Diagnosis not present

## 2022-12-17 DIAGNOSIS — N812 Incomplete uterovaginal prolapse: Secondary | ICD-10-CM | POA: Diagnosis present

## 2022-12-17 DIAGNOSIS — N8003 Adenomyosis of the uterus: Secondary | ICD-10-CM | POA: Insufficient documentation

## 2022-12-17 DIAGNOSIS — Z79890 Hormone replacement therapy (postmenopausal): Secondary | ICD-10-CM | POA: Diagnosis not present

## 2022-12-17 DIAGNOSIS — M1990 Unspecified osteoarthritis, unspecified site: Secondary | ICD-10-CM | POA: Diagnosis not present

## 2022-12-17 DIAGNOSIS — D260 Other benign neoplasm of cervix uteri: Secondary | ICD-10-CM | POA: Diagnosis not present

## 2022-12-17 DIAGNOSIS — N840 Polyp of corpus uteri: Secondary | ICD-10-CM | POA: Diagnosis not present

## 2022-12-17 DIAGNOSIS — N72 Inflammatory disease of cervix uteri: Secondary | ICD-10-CM | POA: Diagnosis not present

## 2022-12-17 DIAGNOSIS — N888 Other specified noninflammatory disorders of cervix uteri: Secondary | ICD-10-CM | POA: Diagnosis not present

## 2022-12-17 DIAGNOSIS — N814 Uterovaginal prolapse, unspecified: Secondary | ICD-10-CM | POA: Diagnosis not present

## 2022-12-17 DIAGNOSIS — D259 Leiomyoma of uterus, unspecified: Secondary | ICD-10-CM | POA: Diagnosis not present

## 2022-12-17 DIAGNOSIS — D649 Anemia, unspecified: Secondary | ICD-10-CM | POA: Insufficient documentation

## 2022-12-17 DIAGNOSIS — Z01818 Encounter for other preprocedural examination: Secondary | ICD-10-CM

## 2022-12-17 HISTORY — PX: XI ROBOTIC ASSISTED TOTAL HYSTERECTOMY WITH SACROCOLPOPEXY: SHX6825

## 2022-12-17 HISTORY — DX: Other specified postprocedural states: Z98.890

## 2022-12-17 HISTORY — DX: Other complications of anesthesia, initial encounter: T88.59XA

## 2022-12-17 HISTORY — PX: PERINEOPLASTY: SHX2218

## 2022-12-17 HISTORY — DX: Unspecified osteoarthritis, unspecified site: M19.90

## 2022-12-17 HISTORY — PX: CYSTOSCOPY: SHX5120

## 2022-12-17 LAB — TYPE AND SCREEN
ABO/RH(D): A POS
Antibody Screen: NEGATIVE

## 2022-12-17 LAB — ABO/RH: ABO/RH(D): A POS

## 2022-12-17 SURGERY — HYSTERECTOMY, TOTAL, ROBOT-ASSISTED, WITH SACROCOLPOPEXY
Anesthesia: General | Site: Perineum

## 2022-12-17 MED ORDER — KETOROLAC TROMETHAMINE 30 MG/ML IJ SOLN
15.0000 mg | Freq: Once | INTRAMUSCULAR | Status: AC
Start: 2022-12-17 — End: 2022-12-17
  Administered 2022-12-17: 15 mg via INTRAVENOUS

## 2022-12-17 MED ORDER — OXYCODONE HCL 5 MG PO TABS: 5.0000 mg | ORAL_TABLET | Freq: Once | ORAL | Status: DC | PRN

## 2022-12-17 MED ORDER — SCOPOLAMINE 1 MG/3DAYS TD PT72
MEDICATED_PATCH | TRANSDERMAL | Status: AC
  Filled 2022-12-17: qty 1

## 2022-12-17 MED ORDER — DEXMEDETOMIDINE HCL IN NACL 80 MCG/20ML IV SOLN
INTRAVENOUS | Status: AC
  Filled 2022-12-17: qty 20

## 2022-12-17 MED ORDER — PHENAZOPYRIDINE HCL 100 MG PO TABS
ORAL_TABLET | ORAL | Status: AC
  Filled 2022-12-17: qty 2

## 2022-12-17 MED ORDER — ROCURONIUM BROMIDE 10 MG/ML (PF) SYRINGE
PREFILLED_SYRINGE | INTRAVENOUS | Status: DC | PRN
  Administered 2022-12-17 (×3): 20 mg via INTRAVENOUS
  Administered 2022-12-17: 60 mg via INTRAVENOUS

## 2022-12-17 MED ORDER — ONDANSETRON HCL 4 MG/2ML IJ SOLN
INTRAMUSCULAR | Status: AC
  Filled 2022-12-17: qty 2

## 2022-12-17 MED ORDER — CEFAZOLIN SODIUM-DEXTROSE 2-4 GM/100ML-% IV SOLN
2.0000 g | INTRAVENOUS | Status: AC
  Administered 2022-12-17: 2 g via INTRAVENOUS

## 2022-12-17 MED ORDER — SODIUM CHLORIDE 0.9 % IR SOLN
Status: DC | PRN
  Administered 2022-12-17: 250 mL via INTRAVESICAL
  Administered 2022-12-17: 1000 mL

## 2022-12-17 MED ORDER — LACTATED RINGERS IV SOLN: INTRAVENOUS | Status: DC

## 2022-12-17 MED ORDER — IBUPROFEN 200 MG PO TABS: 600.0000 mg | ORAL_TABLET | Freq: Four times a day (QID) | ORAL | Status: DC

## 2022-12-17 MED ORDER — SCOPOLAMINE 1 MG/3DAYS TD PT72
1.0000 | MEDICATED_PATCH | TRANSDERMAL | Status: DC
  Administered 2022-12-17: 1.5 mg via TRANSDERMAL

## 2022-12-17 MED ORDER — MIDAZOLAM HCL 2 MG/2ML IJ SOLN
INTRAMUSCULAR | Status: AC
  Filled 2022-12-17: qty 2

## 2022-12-17 MED ORDER — AMISULPRIDE (ANTIEMETIC) 5 MG/2ML IV SOLN
10.0000 mg | Freq: Once | INTRAVENOUS | Status: AC
Start: 2022-12-17 — End: 2022-12-17
  Administered 2022-12-17: 10 mg via INTRAVENOUS

## 2022-12-17 MED ORDER — FENTANYL CITRATE (PF) 100 MCG/2ML IJ SOLN
INTRAMUSCULAR | Status: DC | PRN
  Administered 2022-12-17: 50 ug via INTRAVENOUS
  Administered 2022-12-17: 75 ug via INTRAVENOUS

## 2022-12-17 MED ORDER — ACETAMINOPHEN 10 MG/ML IV SOLN
INTRAVENOUS | Status: AC
  Filled 2022-12-17: qty 100

## 2022-12-17 MED ORDER — BUPIVACAINE HCL (PF) 0.25 % IJ SOLN
INTRAMUSCULAR | Status: DC | PRN
  Administered 2022-12-17: 25 mL

## 2022-12-17 MED ORDER — PROPOFOL 10 MG/ML IV BOLUS
INTRAVENOUS | Status: DC | PRN
  Administered 2022-12-17: 140 mg via INTRAVENOUS

## 2022-12-17 MED ORDER — DEXAMETHASONE SODIUM PHOSPHATE 10 MG/ML IJ SOLN
INTRAMUSCULAR | Status: AC
  Filled 2022-12-17: qty 1

## 2022-12-17 MED ORDER — ONDANSETRON HCL 4 MG/2ML IJ SOLN
4.0000 mg | Freq: Four times a day (QID) | INTRAMUSCULAR | Status: DC | PRN
  Administered 2022-12-17: 4 mg via INTRAVENOUS

## 2022-12-17 MED ORDER — GABAPENTIN 300 MG PO CAPS
ORAL_CAPSULE | ORAL | Status: AC
  Filled 2022-12-17: qty 1

## 2022-12-17 MED ORDER — PROPOFOL 10 MG/ML IV BOLUS
INTRAVENOUS | Status: AC
  Filled 2022-12-17: qty 20

## 2022-12-17 MED ORDER — ACETAMINOPHEN 10 MG/ML IV SOLN
1000.0000 mg | Freq: Once | INTRAVENOUS | Status: DC | PRN
  Administered 2022-12-17: 1000 mg via INTRAVENOUS

## 2022-12-17 MED ORDER — EPHEDRINE SULFATE (PRESSORS) 50 MG/ML IJ SOLN
INTRAMUSCULAR | Status: DC | PRN
  Administered 2022-12-17 (×2): 5 mg via INTRAVENOUS
  Administered 2022-12-17: 10 mg via INTRAVENOUS
  Administered 2022-12-17: 5 mg via INTRAVENOUS

## 2022-12-17 MED ORDER — ROCURONIUM BROMIDE 10 MG/ML (PF) SYRINGE
PREFILLED_SYRINGE | INTRAVENOUS | Status: AC
  Filled 2022-12-17: qty 10

## 2022-12-17 MED ORDER — ACETAMINOPHEN 500 MG PO TABS
ORAL_TABLET | ORAL | Status: AC
  Filled 2022-12-17: qty 2

## 2022-12-17 MED ORDER — STERILE WATER FOR IRRIGATION IR SOLN
Status: DC | PRN
  Administered 2022-12-17: 500 mL

## 2022-12-17 MED ORDER — DROPERIDOL 2.5 MG/ML IJ SOLN: 0.6250 mg | Freq: Once | INTRAMUSCULAR | Status: DC | PRN

## 2022-12-17 MED ORDER — KETOROLAC TROMETHAMINE 30 MG/ML IJ SOLN
INTRAMUSCULAR | Status: AC
  Filled 2022-12-17: qty 1

## 2022-12-17 MED ORDER — OXYCODONE HCL 5 MG/5ML PO SOLN: 5.0000 mg | Freq: Once | ORAL | Status: DC | PRN

## 2022-12-17 MED ORDER — PHENAZOPYRIDINE HCL 100 MG PO TABS
200.0000 mg | ORAL_TABLET | ORAL | Status: AC
  Administered 2022-12-17: 200 mg via ORAL

## 2022-12-17 MED ORDER — ACETAMINOPHEN 500 MG PO TABS: 1000.0000 mg | ORAL_TABLET | ORAL | Status: DC

## 2022-12-17 MED ORDER — GABAPENTIN 300 MG PO CAPS
300.0000 mg | ORAL_CAPSULE | ORAL | Status: AC
Start: 2022-12-17 — End: 2022-12-17
  Administered 2022-12-17: 300 mg via ORAL

## 2022-12-17 MED ORDER — FENTANYL CITRATE (PF) 250 MCG/5ML IJ SOLN
INTRAMUSCULAR | Status: AC
  Filled 2022-12-17: qty 5

## 2022-12-17 MED ORDER — AMISULPRIDE (ANTIEMETIC) 5 MG/2ML IV SOLN
INTRAVENOUS | Status: AC
  Filled 2022-12-17: qty 4

## 2022-12-17 MED ORDER — SIMETHICONE 80 MG PO CHEW: 80.0000 mg | CHEWABLE_TABLET | Freq: Four times a day (QID) | ORAL | Status: DC | PRN

## 2022-12-17 MED ORDER — ACETAMINOPHEN 160 MG/5ML PO SOLN: 325.0000 mg | ORAL | Status: DC | PRN

## 2022-12-17 MED ORDER — CEFAZOLIN SODIUM-DEXTROSE 2-4 GM/100ML-% IV SOLN
INTRAVENOUS | Status: AC
  Filled 2022-12-17: qty 100

## 2022-12-17 MED ORDER — ACETAMINOPHEN 325 MG PO TABS: 325.0000 mg | ORAL_TABLET | ORAL | Status: DC | PRN

## 2022-12-17 MED ORDER — ONDANSETRON HCL 4 MG/2ML IJ SOLN
INTRAMUSCULAR | Status: DC | PRN
  Administered 2022-12-17: 4 mg via INTRAVENOUS

## 2022-12-17 MED ORDER — ACETAMINOPHEN 325 MG PO TABS: 650.0000 mg | ORAL_TABLET | ORAL | Status: DC | PRN

## 2022-12-17 MED ORDER — DEXMEDETOMIDINE HCL IN NACL 80 MCG/20ML IV SOLN
INTRAVENOUS | Status: DC | PRN
  Administered 2022-12-17: 12 ug via INTRAVENOUS
  Administered 2022-12-17: 8 ug via INTRAVENOUS

## 2022-12-17 MED ORDER — LIDOCAINE 2% (20 MG/ML) 5 ML SYRINGE
INTRAMUSCULAR | Status: DC | PRN
  Administered 2022-12-17: 60 mg via INTRAVENOUS

## 2022-12-17 MED ORDER — OXYCODONE HCL 5 MG PO TABS: 5.0000 mg | ORAL_TABLET | ORAL | Status: DC | PRN

## 2022-12-17 MED ORDER — FENTANYL CITRATE (PF) 100 MCG/2ML IJ SOLN
INTRAMUSCULAR | Status: AC
  Filled 2022-12-17: qty 2

## 2022-12-17 MED ORDER — ACETAMINOPHEN 500 MG PO TABS
1000.0000 mg | ORAL_TABLET | Freq: Once | ORAL | Status: AC
Start: 2022-12-17 — End: 2022-12-17
  Administered 2022-12-17: 1000 mg via ORAL

## 2022-12-17 MED ORDER — MIDAZOLAM HCL 5 MG/5ML IJ SOLN
INTRAMUSCULAR | Status: DC | PRN
  Administered 2022-12-17: 2 mg via INTRAVENOUS

## 2022-12-17 MED ORDER — ONDANSETRON HCL 4 MG PO TABS: 4.0000 mg | ORAL_TABLET | Freq: Four times a day (QID) | ORAL | Status: DC | PRN

## 2022-12-17 MED ORDER — DEXAMETHASONE SODIUM PHOSPHATE 10 MG/ML IJ SOLN
INTRAMUSCULAR | Status: DC | PRN
  Administered 2022-12-17: 4 mg via INTRAVENOUS

## 2022-12-17 MED ORDER — SUGAMMADEX SODIUM 200 MG/2ML IV SOLN
INTRAVENOUS | Status: DC | PRN
  Administered 2022-12-17: 200 mg via INTRAVENOUS

## 2022-12-17 MED ORDER — LIDOCAINE HCL (PF) 2 % IJ SOLN
INTRAMUSCULAR | Status: AC
  Filled 2022-12-17: qty 5

## 2022-12-17 MED ORDER — LIDOCAINE-EPINEPHRINE 1 %-1:100000 IJ SOLN
INTRAMUSCULAR | Status: DC | PRN
  Administered 2022-12-17: 10 mL

## 2022-12-17 MED ORDER — FENTANYL CITRATE (PF) 100 MCG/2ML IJ SOLN
25.0000 ug | INTRAMUSCULAR | Status: DC | PRN
  Administered 2022-12-17: 50 ug via INTRAVENOUS

## 2022-12-17 SURGICAL SUPPLY — 81 items
BAG URINE DRAIN 2000ML AR STRL (UROLOGICAL SUPPLIES) IMPLANT
BLADE SURG 15 STRL LF DISP TIS (BLADE) ×3 IMPLANT
CATH FOLEY 3WAY 5CC 16FR (CATHETERS) ×3 IMPLANT
CHLORAPREP W/TINT 26 (MISCELLANEOUS) ×3 IMPLANT
COVER BACK TABLE 60X90IN (DRAPES) ×3 IMPLANT
COVER TIP SHEARS 8 DVNC (MISCELLANEOUS) ×3 IMPLANT
DEFOGGER SCOPE WARMER CLEARIFY (MISCELLANEOUS) ×3 IMPLANT
DERMABOND ADVANCED .7 DNX12 (GAUZE/BANDAGES/DRESSINGS) ×3 IMPLANT
DRAPE ARM DVNC X/XI (DISPOSABLE) ×12 IMPLANT
DRAPE COLUMN DVNC XI (DISPOSABLE) ×3 IMPLANT
DRAPE SHEET LG 3/4 BI-LAMINATE (DRAPES) ×3 IMPLANT
DRAPE SURG IRRIG POUCH 19X23 (DRAPES) ×3 IMPLANT
DRAPE UTILITY XL STRL (DRAPES) ×3 IMPLANT
DRIVER NDL LRG 8 DVNC XI (INSTRUMENTS) ×3 IMPLANT
DRIVER NDL MEGA SUTCUT DVNCXI (INSTRUMENTS) ×3 IMPLANT
DRIVER NDLE LRG 8 DVNC XI (INSTRUMENTS) ×3
DRIVER NDLE MEGA SUTCUT DVNCXI (INSTRUMENTS) ×3
ELECT REM PT RETURN 9FT ADLT (ELECTROSURGICAL) ×3
ELECTRODE REM PT RTRN 9FT ADLT (ELECTROSURGICAL) ×3 IMPLANT
FORCEPS BPLR 8 MD DVNC XI (FORCEP) ×3 IMPLANT
GAUZE 4X4 16PLY ~~LOC~~+RFID DBL (SPONGE) ×3 IMPLANT
GLOVE BIOGEL PI IND STRL 6.5 (GLOVE) ×12 IMPLANT
GLOVE BIOGEL PI IND STRL 7.0 (GLOVE) ×3 IMPLANT
GLOVE ECLIPSE 6.0 STRL STRAW (GLOVE) ×9 IMPLANT
GOWN STRL REUS W/TWL LRG LVL3 (GOWN DISPOSABLE) ×3 IMPLANT
GRASPER TIP-UP FEN DVNC XI (INSTRUMENTS) ×3 IMPLANT
HOLDER FOLEY CATH W/STRAP (MISCELLANEOUS) ×3 IMPLANT
IRRIG SUCT STRYKERFLOW 2 WTIP (MISCELLANEOUS) ×3
IRRIGATION SUCT STRKRFLW 2 WTP (MISCELLANEOUS) ×3 IMPLANT
IV NS 1000ML BAXH (IV SOLUTION) IMPLANT
IV NS 250ML BAXH (IV SOLUTION) IMPLANT
KIT PINK PAD W/HEAD ARE REST (MISCELLANEOUS) ×3
KIT PINK PAD W/HEAD ARM REST (MISCELLANEOUS) ×3 IMPLANT
KIT TURNOVER CYSTO (KITS) ×3 IMPLANT
LEGGING LITHOTOMY PAIR STRL (DRAPES) ×3 IMPLANT
MANIFOLD NEPTUNE II (INSTRUMENTS) ×3 IMPLANT
MANIPULATOR ADVINCU DEL 2.5 PL (MISCELLANEOUS) IMPLANT
MANIPULATOR ADVINCU DEL 3.0 PL (MISCELLANEOUS) IMPLANT
MANIPULATOR ADVINCU DEL 3.5 PL (MISCELLANEOUS) IMPLANT
MANIPULATOR ADVINCU DEL 4.0 PL (MISCELLANEOUS) IMPLANT
MESH VERTESSA LITE -Y 2X4X3 (Mesh General) IMPLANT
NDL HYPO 22X1.5 SAFETY MO (MISCELLANEOUS) ×3 IMPLANT
NDL INSUFFLATION 14GA 120MM (NEEDLE) ×3 IMPLANT
NEEDLE HYPO 22X1.5 SAFETY MO (MISCELLANEOUS) ×3
NEEDLE INSUFFLATION 14GA 120MM (NEEDLE) ×3
NS IRRIG 1000ML POUR BTL (IV SOLUTION) ×3 IMPLANT
OBTURATOR OPTICAL STND 8 DVNC (TROCAR) ×3
OBTURATOR OPTICALSTD 8 DVNC (TROCAR) ×3 IMPLANT
PACK CYSTO (CUSTOM PROCEDURE TRAY) ×3 IMPLANT
PACK ROBOT WH (CUSTOM PROCEDURE TRAY) ×3 IMPLANT
PACK ROBOTIC GOWN (GOWN DISPOSABLE) ×3 IMPLANT
PACK VAGINAL WOMENS (CUSTOM PROCEDURE TRAY) ×3 IMPLANT
PAD OB MATERNITY 4.3X12.25 (PERSONAL CARE ITEMS) ×3 IMPLANT
PAD PREP 24X48 CUFFED NSTRL (MISCELLANEOUS) ×3 IMPLANT
POUCH LAPAROSCOPIC INSTRUMENT (MISCELLANEOUS) IMPLANT
PROTECTOR NERVE ULNAR (MISCELLANEOUS) ×3 IMPLANT
SCISSORS MNPLR CVD DVNC XI (INSTRUMENTS) ×3 IMPLANT
SCRUB CHG 4% DYNA-HEX 4OZ (MISCELLANEOUS) ×3 IMPLANT
SEAL UNIV 5-12 XI (MISCELLANEOUS) ×15 IMPLANT
SEALER VESSEL EXT DVNC XI (MISCELLANEOUS) IMPLANT
SET IRRIG Y TYPE TUR BLADDER L (SET/KITS/TRAYS/PACK) ×3 IMPLANT
SET TUBE SMOKE EVAC HIGH FLOW (TUBING) ×3 IMPLANT
SLEEVE SCD COMPRESS KNEE MED (STOCKING) ×3 IMPLANT
SPIKE FLUID TRANSFER (MISCELLANEOUS) ×6 IMPLANT
SUCTION TUBE FRAZIER 10FR DISP (SUCTIONS) ×3 IMPLANT
SUT GORETEX NAB #0 THX26 36IN (SUTURE) ×3 IMPLANT
SUT MNCRL AB 4-0 PS2 18 (SUTURE) ×6 IMPLANT
SUT MON AB 2-0 SH 27 (SUTURE) ×3 IMPLANT
SUT V-LOC BARB 180 2/0GR9 GS23 (SUTURE) ×6
SUT VIC AB 0 CT1 27XBRD ANBCTR (SUTURE) IMPLANT
SUT VIC AB 0 CT1 27XBRD ANTBC (SUTURE) ×6 IMPLANT
SUT VIC AB 2-0 SH 27XBRD (SUTURE) IMPLANT
SUT VIC AB 3-0 SH 18 (SUTURE) IMPLANT
SUT VIC AB CT1 27XBRD ANBCTRL (SUTURE) ×3
SUT VICRYL 2-0 SH 8X27 (SUTURE) IMPLANT
SUT VLOC 180 0 9IN GS21 (SUTURE) ×3 IMPLANT
SUT VLOC 180 2-0 9IN GS21 (SUTURE) IMPLANT
SUTURE V-LC BRB 180 2/0GR9GS23 (SUTURE) ×6 IMPLANT
SYR BULB EAR ULCER 3OZ GRN STR (SYRINGE) ×3 IMPLANT
TOWEL OR 17X24 6PK STRL BLUE (TOWEL DISPOSABLE) ×3 IMPLANT
WATER STERILE IRR 500ML POUR (IV SOLUTION) IMPLANT

## 2022-12-17 NOTE — Progress Notes (Signed)
12/17/2022 2:09 PM Pt. With severe nausea post op. Pt. With eyes closed, wincing in pain. No vomitting noted. Zofran iv given per post op PRN orders. Pt. Unable to tolerate oral tablets at this time. Pt. With significant hx of PONV. Verbal order Dr. Florian Buff ok to administer Toradol 15 mg IV x1 in replacement of Advil order for 1400. Patient states she would greatly like to avoid narcotics if at all possible for pain management. Spoke with Dr. Armond Hang with anesthesia about possibly using IV Tylenol order from PACU for pain management since patient unable to tolerate PO at this time. Verbal order received to please give patient Barhemsys 10 mg IV x1, ok to give IV Tylenol 1 g if needed and if ineffective, it is ok to give 50 mcg Fentanyl IV x1 if needed. Orders placed and enacted.  Dandrae Kustra, Blanchard Kelch

## 2022-12-17 NOTE — Op Note (Signed)
Operative Note  Preoperative Diagnosis: anterior vaginal prolapse, posterior vaginal prolapse, and uterovaginal prolapse, incomplete  Postoperative Diagnosis: same  Procedures performed:  Robotic assisted total laparoscopic hysterectomy, bilateral salpingo-oophorectomy, sacrocolpopexy [name], cystoscopy, perineorrhaphy  Implants:  Implant Name Type Inv. Item Serial No. Manufacturer Lot No. LRB No. Used Action  MESH Grayland Ormond [id number] Mesh General MESH Arlys John  [facility] MEDICAL [id number] N/A 1 Implanted    Attending Surgeon: Lanetta Inch, MD  Anesthesia: General endotracheal  Findings: 1. On vaginal exam, stage II prolapse noted  2. On  laparoscopy, normal appearing uterus, bilateral fallopian tubes and ovaries  3. On cystoscopy, normal bladder and urethra without injury or lesion. Brisk bilateral ureteral efflux noted.   Specimens:  ID Type Source Tests Collected by Time Destination  1 : Uterus, cervix, bilateral fallopian tubes, bilateral ovaries Tissue PATH Gyn benign resection SURGICAL PATHOLOGY [name], MD 12/17/2022 [phone]     Estimated blood loss: 250 mL  IV fluids: 400 mL  Urine output: 400 mL  Complications: none  Procedure in Detail:  After informed consent was obtained, the patient was taken to the operating room, where general anesthesia was induced and found to be adequate. She was placed in dorsolithotomy position in yellowfin stirrups. Her hips were noted not to be hyperflexed or hyperextended. Her arms were padded with gel pads and tucked to her sides. Her hands were surrounded by foam. A padded strap was placed across her chest with foam between the pad and her skin. She was noted to be appropriately positioned with all pressure points well padded and off tension. A tilt test showed no slippage. She was prepped and draped in the usual sterile fashion. A uterine manipulator was placed in the uterus after  sounding to 8 cm, an appropriately sized Koh ring was placed around the cervix, and a pneumo-occluder balloon was positioned in the vagina for later use.  A sterile Foley catheter was inserted.   0.25% plain Marcaine was injected in the supraumbilical  area and an 8 mm supraumbilical skin incision was made with the scalpel.  A Veress needle was inserted into the incision, CO2 insufflation was started, a low opening pressure was noted, and pneumoperitoneum was obtained. The Veress needle was removed and a 8mm robotic trocar was placed. The robotic camera was inserted and intraperitoneal placement was confirmed. Survey of the abdomen and pelvis revealed the findings as noted above. The sacrum appeared to be free of any adhesive disease. After determining placement for the other ports, Local anesthetic was injected at each site and two 8 mm incisions were made for robotic ports at 10 cm lateral to and at the level of the umbilical port. Two additional 8 mm incisions were made 10 cm lateral to these and 30 degrees down followed by 8 mm robotic ports - the right side for an assistant port. All trocars were placed sequentially under direct visualization of the camera. The patient was placed in Trendelenburg. The robot was docked on the patient's right side. Monopolar endoshears alternating with the vessel sealer was placed in the right arm, a [place] bipolar grasper was placed in the 2nd arm of the patient's left side, and a Tip up grasper was placed in the 3rd arm on the patient's left side.   Attention was then turned to the sacral promontory. The peritoneum overlying the sacral promontory was tented up, dissected sharply with monopolar scissors and electrosurgery using layer by layer technique. The  peritoneal incision was extended down to the posterior cul-de-sac. This was performed with care to avoid the ureter on the right side and the sigmoid colon and its mesentary on the left side. Two transverse sutures of  CV2 Gortex were placed in the anterior longitudinal ligament distal to the sacral promontory.  Attention was then turned to the robotic hysterectomy and BSO. The ureter was identified and was found to be well away from the planned site of incision. Using the monopolar scissors, a window was made on the posterior leaf of the broad ligament. The infundibulopelvic ligament was cauterized and transected. The right round ligament was grasped, cauterized, and transected with electrocautery. The anterior and posterior leaves of the broad ligament were taken down with cautery and sharp dissection. The uterine artery was skeletonized and the bladder flap was created on the right side with a combination of electrosurgery and sharp dissection. The KOH ring was identified. The right uterine artery was clamped, cauterized, and transected. In a similar fashion, the left side was taken down. Further sharp dissection with combination of cautery was performed to further develop the bladder flap. At this point, the KOH ring was completely hugging the cervix. The pneumo-occluder balloon in the vagina was inflated to maintain pneumoperitoneum. A colpotomy was performed with electrosurgical cutting current and the uterus and cervix were completely amputated from the vagina. The specimen was delivered through the vagina. The posterior portion of the vaginal cuff was then grasped and pulled up to maintain pneumoperitoneum. The pneumo-occluder balloon was then replaced in the vagina. The right hand instrument was changed to a suture-cut needle driver. The vaginal cuff was then closed using a 0 V-lock suture in two layers.    The right hand instrument was replaced with monopolar endoshears. With a lucite probe in the vagina, the anterior vaginal dissection was then performed with sharp dissection and electrosurgery. The posterior vaginal dissection was then performed with sharp dissection and electrosurgery in order to dissect the rectum  away from the posterior vagina.  A "Y" mesh was then inserted into the abdomen after trimming to appropriate size. With the probe in the vagina, the anterior leaf of the Y mesh was affixed to the anterior portion of the vagina using a 2-0 v-loc suture in a spiral pattern to distribute the suture evenly across the surface of the anterior mesh leaf. In a similar fashion, the posterior leaf of the Y mesh was attached to the posterior surface of the vagina with 2-0 v-loc suture.  The distal end of the mesh was then brought to overlie the sacrum. The correct amount of tension was determined in order to elevate the vagina, but not put the mesh under tension. The distal end of the mesh was then affixed to the anterior longitudinal sacral ligament using the previously placed stitches of CV2 Gortex. The excess distal mesh was then cut and removed. The peritoneum was reapproximated over the mesh using 2-0 monocryl. The bladder flap was incorporated to completely retroperitonealize the mesh. All pedicles were carefully inspected and noted to be hemostatic as the CO2 gas was deflated. All instruments were removed from the patient's abdomen.   The Foley catheter was removed.  A 70-degree cystoscope was introduced, and 360-degree inspection revealed no injury, lesion or foreign body in the bladder. Brisk bilateral ureteral efflux was noted with the assistance of pyridium.  The bladder was drained and the cystoscope was removed.  The Foley catheter was replaced.  The robot was undocked. The CO2 gas  was removed and the ports were removed.  The skin incisions were closed with subcutaneous stitches of 4-0 Monocryl and covered with skin glue.    Attention was then turned to the perineum. Two allis clamps were placed at the introitus. The perineum was injected with 1% lidocaine with epinephrine. A diamond shaped incision was made over the perineum and excess skin was removed. Dissection was performed with Metzenbaum scissors to  separate the mucosa from the underlying tissue. The perineal body was then reapproximated with three interrupted 0-vicryl sutures. The perineal skin was then closed with a 2-0 vicryl in a subcutaneous and running fashion. Irrigation was performed and good hemostasis was noted. Sponge, lap, and needle counts were correct x 2. The patient tolerated the procedure well. She was awakened from anesthesia and transferred to the recovery room in stable condition.   [name], MD

## 2022-12-17 NOTE — Discharge Instructions (Addendum)
NO TYLENOL OR IBUPROFEN UNTIL AFTER 8:30 PM TONIGHT  POST OPERATIVE INSTRUCTIONS  General Instructions Recovery (not bed rest) will last approximately 6 weeks Walking is encouraged, but refrain from strenuous exercise/ housework/ heavy lifting. No lifting >10lbs  Nothing in the vagina- NO intercourse, tampons or douching Bathing:  Do not submerge in water (NO swimming, bath, hot tub, etc) until after your postop visit. You can shower starting the day after surgery.  No driving until you are not taking narcotic pain medicine and until your pain is well enough controlled that you can slam on the breaks or make sudden movements if needed.   Taking your medications Please take your acetaminophen and ibuprofen on a schedule for the first 48 hours. Take 600mg  ibuprofen, then take 500mg  acetaminophen 3 hours later, then continue to alternate ibuprofen and acetaminophen. That way you are taking each type of medication every 6 hours. Take the prescribed narcotic (oxycodone, tramadol, etc) as needed, with a maximum being every 4 hours.  Take a stool softener daily to keep your stools soft and preventing you from straining. If you have diarrhea, you decrease your stool softener. This is explained more below. We have prescribed you Miralax.  Reasons to Call the Nurse (see last page for phone numbers) Heavy Bleeding (changing your pad every 1-2 hours) Persistent nausea/vomiting Fever (100.4 degrees or more) Incision problems (pus or other fluid coming out, redness, warmth, increased pain)  Things to Expect After Surgery Mild to Moderate pain is normal during the first day or two after surgery. If prescribed, take Ibuprofen or Tylenol first and use the stronger medicine for "break-through" pain. You can overlap these medicines because they work differently.   Constipation   To Prevent Constipation:  Eat a well-balanced diet including protein, grains, fresh fruit and vegetables.  Drink plenty of fluids.  Walk regularly.  Depending on specific instructions from your physician: take Miralax daily and additionally you can add a stool softener (colace/ docusate) and fiber supplement. Continue as long as you're on pain medications.   To Treat Constipation:  If you do not have a bowel movement in 2 days after surgery, you can take 2 Tbs of Milk of Magnesia 1-2 times a day until you have a bowel movement. If diarrhea occurs, decrease the amount or stop the laxative. If no results with Milk of Magnesia, you can drink a bottle of magnesium citrate which you can purchase over the counter.  Fatigue:  This is a normal response to surgery and will improve with time.  Plan frequent rest periods throughout the day.  Gas Pain:  This is very common but can also be very painful! Drink warm liquids such as herbal teas, bouillon or soup. Walking will help you pass more gas.  Mylicon or Gas-X can be taken over the counter.  Leaking Urine:  Varying amounts of leakage may occur after surgery.  This should improve with time. Your bladder needs at least 3 months to recover from surgery. If you leak after surgery, be sure to mention this to your doctor at your post-op visit. If you were taking medications for overactive bladder prior to surgery, be sure to restart the medications immediately after surgery.  Incisions: If you have incisions on your abdomen, the skin glue will dissolve on its own over time. It is ok to gently rinse with soap and water over these incisions but do not scrub.  Catheter Approximately 50% of patients are unable to urinate after surgery and need to  go home with a catheter. This allows your bladder to rest so it can return to full function. If you go home with a catheter, the office will call to set up a voiding trial a few days after surgery. For most patients, by this visit, they are able to urinate on their own. Long term catheter use is rare.   Return to Work  As work demands and recovery times  vary widely, it is hard to predict when you will want to return to work. If you have a desk job with no strenuous physical activity, and if you would like to return sooner than generally recommended, discuss this with your provider or call our office.   Post op concerns  For non-emergent issues, please call the Urogynecology Nurse. Please leave a message and someone will contact you within one business day.  You can also send a message through MyChart.   AFTER HOURS (After 5:00 PM and on weekends):  For urgent matters that cannot wait until the next business day. Call our office [phone] and connect to the doctor on call.  Please reserve this for important issues.   **FOR ANY TRUE EMERGENCY ISSUES CALL 911 OR GO TO THE NEAREST EMERGENCY ROOM.** Please inform our office or the doctor on call of any emergency.     APPOINTMENTS: Call [phone]

## 2022-12-17 NOTE — Interval H&P Note (Signed)
History and Physical Interval Note:  12/17/2022 8:17 AM  April Stone  has presented today for surgery, with the diagnosis of anterior vaginal prolpase; uterovaginal prolapse incomplete; posterior vaginal prolapse;[name]  The various methods of treatment have been discussed with the patient and family. After consideration of risks, benefits and other options for treatment, the patient has consented to  Procedure(s) with comments: XI ROBOTIC ASSISTED TOTAL HYSTERECTOMY WITH BILATERAL SALPINGO-OOPHORECTOMY AND SACROCOLPOPEXY (N/A)  PERINEOPLASTY (N/A) CYSTOSCOPY (N/A) as a surgical intervention.  The patient's history has been reviewed, patient examined, no change in status, stable for surgery.  I have reviewed the patient's chart and labs.  Questions were answered to the patient's satisfaction.     [name]

## 2022-12-17 NOTE — Anesthesia Postprocedure Evaluation (Signed)
Anesthesia Post Note  Patient: [name]  Procedure(s) Performed: XI ROBOTIC ASSISTED TOTAL HYSTERECTOMY WITH BILATERAL SALPINGO-OOPHORECTOMY AND SACROCOLPOPEXY (Bilateral: Abdomen) PERINEOPLASTY (Perineum) CYSTOSCOPY (Bladder)     Patient location during evaluation: PACU Anesthesia Type: General Level of consciousness: awake and alert Pain management: pain level controlled Vital Signs Assessment: post-procedure vital signs reviewed and stable Respiratory status: spontaneous breathing, nonlabored ventilation, respiratory function stable and patient connected to nasal cannula oxygen Cardiovascular status: blood pressure returned to baseline and stable Postop Assessment: no apparent nausea or vomiting Anesthetic complications: yes  Encounter Notable Events  Notable Event Outcome Phase Comment  Difficult to intubate - expected  Intraprocedure Filed from anesthesia note documentation.    Last Vitals:  Vitals:   12/17/22 1245 12/17/22 1300  BP: 124/78 119/75  Pulse: 79 76  Resp: 19 16  Temp:  (!) 36.3 C  SpO2: 93% 96%    Last Pain:  Vitals:   12/17/22 1245  TempSrc:   PainSc: 8                  Shelton Silvas

## 2022-12-17 NOTE — Transfer of Care (Signed)
Immediate Anesthesia Transfer of Care Note  Patient: April Stone  Procedure(s) Performed: XI ROBOTIC ASSISTED TOTAL HYSTERECTOMY WITH BILATERAL SALPINGO-OOPHORECTOMY AND SACROCOLPOPEXY (Bilateral: Abdomen) PERINEOPLASTY (Perineum) CYSTOSCOPY (Bladder)  Patient Location: PACU  Anesthesia Type:General  Level of Consciousness: sedated  Airway & Oxygen Therapy: Patient Spontanous Breathing and Patient connected to nasal cannula oxygen  Post-op Assessment: Report given to RN  Post vital signs: Reviewed and stable  Last Vitals:  Vitals Value Taken Time  BP 116/82   Temp    Pulse 73 12/17/22 1152  Resp 14 12/17/22 1152  SpO2 98 % 12/17/22 1152  Vitals shown include unfiled device data.  Last Pain:  Vitals:   12/17/22 0705  TempSrc: Oral  PainSc: 0-No pain      Patients Stated Pain Goal: 5 (12/17/22 0705)  Complications:  Encounter Notable Events  Notable Event Outcome Phase Comment  Difficult to intubate - expected  Intraprocedure Filed from anesthesia note documentation.

## 2022-12-17 NOTE — Anesthesia Procedure Notes (Signed)
Procedure Name: Intubation Date/Time: 12/17/2022 8:45 AM  Performed by: Briant Sites, CRNAPre-anesthesia Checklist: Patient identified, Emergency Drugs available, Suction available and Patient being monitored Patient Re-evaluated:Patient Re-evaluated prior to induction Oxygen Delivery Method: Circle system utilized Preoxygenation: Pre-oxygenation with 100% oxygen Induction Type: IV induction Ventilation: Mask ventilation without difficulty Laryngoscope Size: Glidescope and 3 Grade View: Grade IV Tube type: Oral Tube size: 7.0 mm Number of attempts: 1 Airway Equipment and Method: Stylet Placement Confirmation: ETT inserted through vocal cords under direct vision, positive ETCO2 and breath sounds checked- equal and bilateral Secured at: 20 cm Tube secured with: Tape Dental Injury: Teeth and Oropharynx as per pre-operative assessment  Difficulty Due To: Difficulty was anticipated and Difficult Airway- due to limited oral opening Comments: Easy mask, first DL w Grade 4 view/ Intubated using glidescope without difficulty.  O2 sat unchanged throughout.

## 2022-12-18 ENCOUNTER — Telehealth: Payer: Self-pay | Admitting: Obstetrics and Gynecology

## 2022-12-18 LAB — SURGICAL PATHOLOGY

## 2022-12-18 NOTE — Telephone Encounter (Signed)
April Stone underwent Robotic assisted total laparoscopic hysterectomy, bilateral salpingo-oophorectomy, sacrocolpopexy [name], cystoscopy, perineorrhaphy on 12/18/22.   She passed her voiding trial.  was backfilled into the bladder Voided  PVR by bladder scan was 20ml.   She was discharged without a catheter. Please call her for a routine post op check. Thanks!  [name], MD

## 2022-12-19 ENCOUNTER — Encounter (HOSPITAL_BASED_OUTPATIENT_CLINIC_OR_DEPARTMENT_OTHER): Payer: Self-pay | Admitting: Obstetrics and Gynecology

## 2022-12-19 NOTE — Telephone Encounter (Signed)
Post- Op Call  April Stone underwent XI ROBOTIC ASSISTED TOTAL HYSTERECTOMY WITH BILATERAL SALPINGO-OOPHORECTOMY AND SACROCOLPOPEXY (Bilateral: Abdomen)  PERINEOPLASTY (Perineum)  CYSTOSCOPY (Bladder)  on 12/17/2022 with Dr Florian Buff. The patient reports that her pain is controlled. She is taking Tylenol and Ibuprofen . She denies vaginal bleeding. She has had a bowel movement and is taking Mira lax for a bowel regimen. She was discharged without a catheter.  Thressa Sheller, CMA

## 2023-01-30 ENCOUNTER — Other Ambulatory Visit (HOSPITAL_BASED_OUTPATIENT_CLINIC_OR_DEPARTMENT_OTHER): Payer: Self-pay

## 2023-01-30 ENCOUNTER — Encounter: Payer: Self-pay | Admitting: Obstetrics and Gynecology

## 2023-01-30 ENCOUNTER — Ambulatory Visit (INDEPENDENT_AMBULATORY_CARE_PROVIDER_SITE_OTHER): Payer: Commercial Managed Care - PPO | Admitting: Obstetrics and Gynecology

## 2023-01-30 VITALS — BP 127/84 | HR 70

## 2023-01-30 DIAGNOSIS — N3281 Overactive bladder: Secondary | ICD-10-CM | POA: Diagnosis not present

## 2023-01-30 DIAGNOSIS — N393 Stress incontinence (female) (male): Secondary | ICD-10-CM

## 2023-01-30 DIAGNOSIS — Z48816 Encounter for surgical aftercare following surgery on the genitourinary system: Secondary | ICD-10-CM

## 2023-01-30 DIAGNOSIS — Z9889 Other specified postprocedural states: Secondary | ICD-10-CM

## 2023-01-30 MED ORDER — SOLIFENACIN SUCCINATE 5 MG PO TABS
5.0000 mg | ORAL_TABLET | Freq: Every day | ORAL | 5 refills | Status: DC
  Filled 2023-01-30: qty 30, 30d supply, fill #0

## 2023-01-30 NOTE — Progress Notes (Signed)
 [facility]  Date of Visit: 01/30/2023  History of Present Illness: Ms. April Stone is a 63 y.o. female scheduled today for a post-operative visit.   Surgery: s/p Robotic assisted total laparoscopic hysterectomy, bilateral salpingo-oophorectomy, sacrocolpopexy April Stone Y), cystoscopy, perineorrhaphy on 12/17/22  She passed her postoperative void trial.   Postoperative course has been uncomplicated.   Today she reports she is leaking 4-5 times per day, more like a bladder spasm with a small amount of urine coming out. If she bends over, she has leakage or a big cough. She is emptying her bladder well.  Has been using the vaginal estrogen twice a week.   UTI in the last 6 weeks? No  Pain? No  She has returned to her normal activity (except for postop restrictions) Vaginal bulge? No  Stress incontinence: Yes  Urgency/frequency: Yes  Urge incontinence: Yes  Voiding dysfunction: No  Bowel issues: No   Subjective Success: Do you usually have a bulge or something falling out that you can see or feel in the vaginal area? No  Retreatment Success: Any retreatment with surgery or pessary for any compartment? No   Pathology results: UTERUS WITH RIGHT AND LEFT FALLOPIAN TUBE AND OVARY, HYSTERECTOMY AND  BILATERAL SALPINGECTOMY:  Benign cervical leiomyoma measuring 1 cm in greatest dimension  Benign endometrial polyps  Superficial adenomyosis  Benign inactive to weakly proliferative phase endometrium  Chronic cervicitis with squamous metaplasia  Benign fallopian tubes and ovaries   Medications: She has a current medication list which includes the following prescription(s): acetaminophen , albuterol , vitamin d , estradiol , fluticasone , ibuprofen , montelukast , polyethylene glycol powder, solifenacin , and synthroid .   Allergies: Patient has no known allergies.   Physical Exam: BP 127/84   Pulse 70   LMP 08/01/2013   Abdomen: soft, non-tender, without masses or  organomegaly Laparoscopic Incisions: healing well.  Pelvic Examination: Vagina: Incisions healing well. Sutures are present at the cuff and there is not granulation tissue. No tenderness along the anterior or posterior vagina. No apical tenderness. No pelvic masses. No visible or palpable mesh.  POP-Q: POP-Q  -3                                            Aa   -3                                           Ba  -8                                              C   3.5                                            Gh  5                                            Pb  8  tvl   -2                                            Ap  -2                                            Bp                                                 D     ---------------------------------------------------------  Assessment and Plan:  1. Post-operative state   2. Overactive bladder   3. SUI (stress urinary incontinence, female)     - Pathology results reviewed and benign - Can resume regular activity including exercise. Wait an additional 6 weeks for intercourse. Discussed avoidance of heavy lifting and straining long term to reduce the risk of recurrence. Return to work letter provided. - For OAB symptoms, we discussed starting pelvic PT or medication. She is interested in medication. Prescribed vesicare  5mg  daily. ' - For SUI, discussed possible pelvic PT. May improve with time. Will reassess symptoms in 6 more weeks. If she still has symptoms, will discuss possible procedures.   All questions answered.   Return in about 6 weeks (around 03/13/2023).  April LOISE Caper, MD

## 2023-02-16 ENCOUNTER — Telehealth: Payer: Commercial Managed Care - PPO

## 2023-02-16 DIAGNOSIS — R3989 Other symptoms and signs involving the genitourinary system: Secondary | ICD-10-CM

## 2023-02-17 ENCOUNTER — Other Ambulatory Visit (HOSPITAL_BASED_OUTPATIENT_CLINIC_OR_DEPARTMENT_OTHER): Payer: Self-pay

## 2023-02-17 MED ORDER — CEPHALEXIN 500 MG PO CAPS
500.0000 mg | ORAL_CAPSULE | Freq: Two times a day (BID) | ORAL | 0 refills | Status: AC
  Filled 2023-02-17: qty 14, 7d supply, fill #0

## 2023-02-17 NOTE — Progress Notes (Signed)
I have spent 5 minutes in review of e-visit questionnaire, review and updating patient chart, medical decision making and response to patient.   Mia Milan Cody Jacklynn Dehaas, PA-C    

## 2023-02-17 NOTE — Progress Notes (Signed)

## 2023-03-12 ENCOUNTER — Encounter: Payer: Self-pay | Admitting: Obstetrics and Gynecology

## 2023-03-12 ENCOUNTER — Other Ambulatory Visit (HOSPITAL_BASED_OUTPATIENT_CLINIC_OR_DEPARTMENT_OTHER): Payer: Self-pay

## 2023-03-12 ENCOUNTER — Ambulatory Visit: Payer: Commercial Managed Care - PPO | Admitting: Obstetrics and Gynecology

## 2023-03-12 VITALS — BP 117/79 | HR 67

## 2023-03-12 DIAGNOSIS — N393 Stress incontinence (female) (male): Secondary | ICD-10-CM

## 2023-03-12 DIAGNOSIS — N3281 Overactive bladder: Secondary | ICD-10-CM

## 2023-03-12 MED ORDER — TROSPIUM CHLORIDE 20 MG PO TABS
20.0000 mg | ORAL_TABLET | Freq: Two times a day (BID) | ORAL | 5 refills | Status: DC
  Filled 2023-03-12: qty 60, 30d supply, fill #0
  Filled 2023-04-06: qty 60, 30d supply, fill #1
  Filled 2023-04-29: qty 60, 30d supply, fill #2
  Filled 2023-06-02: qty 60, 30d supply, fill #3
  Filled 2023-07-08: qty 60, 30d supply, fill #4

## 2023-03-12 NOTE — Assessment & Plan Note (Signed)
-   Discussed that trospium does not cross the blood brain barrier so safer for long term use.

## 2023-03-12 NOTE — Assessment & Plan Note (Signed)
-   Continue with pelvic floor exercises. Does not want further treatment at this time.

## 2023-03-12 NOTE — Progress Notes (Signed)
[facility] Return Visit  SUBJECTIVE  History of Present Illness: April Stone is a 63 y.o. female seen in follow-up for mixed incontinence, urge predominant. Plan at last visit was to start vesicare.  She had a virtual visit for UTI with PCP on 02/16/23 and was treated with keflex. UTI symptoms have resolved.    She did not take the vesicare because she was concerned about the side effect of cognitive dysfunction due to family history of dementia. Has stopped caffeine and this has really helped her symptoms. If she has a really full bladder, may leak some with urgency, maybe 1-2 times per day. She can exercise without leakage and doing pelvic floor exercise. Leakage with sneezing only happens if she sneezes multiple times and only a small amount. Overall symptoms have improved a lot.   Surgery: s/p Robotic assisted total laparoscopic hysterectomy, bilateral salpingo-oophorectomy, sacrocolpopexy [name], cystoscopy, perineorrhaphy on 12/17/22. SUI not demonstrated on prior Urodynamics.  Past Medical History: Patient  has a past medical history of Anemia, Arthritis, Asthma, Complication of anesthesia, PONV (postoperative nausea and vomiting), and Thyroid cancer (HCC).   Past Surgical History: She  has a past surgical history that includes Appendectomy (1968); Thyroidectomy (2002); Bunionectomy (1996); Tonsillectomy (1967); Xi robotic assisted total hysterectomy with sacrocolpopexy (Bilateral, 12/17/2022); Perineoplasty (N/A, 12/17/2022); and Cystoscopy (N/A, 12/17/2022).   Medications: She has a current medication list which includes the following prescription(s): trospium, acetaminophen, albuterol, vitamin d, estradiol, fluticasone, ibuprofen, montelukast, polyethylene glycol powder, and synthroid.   Allergies: Patient has no known allergies.   Social History: Patient  reports that she has never smoked. She has never used smokeless tobacco. She reports current  alcohol use. She reports that she does not use drugs.     OBJECTIVE     Physical Exam: Vitals:   03/12/23 0817  BP: 117/79  Pulse: 67   Gen: No apparent distress, A&O x 3.  Detailed Urogynecologic Evaluation:  Deferred.    ASSESSMENT AND PLAN    Ms. Like is a 63 y.o. with:  1. Overactive bladder   2. SUI (stress urinary incontinence, female)     Overactive bladder Assessment & Plan: - Discussed that trospium does not cross the blood brain barrier so safer for long term use.   Orders: -     Trospium Chloride; Take 1 tablet (20 mg total) by mouth 2 (two) times daily.  Dispense: 60 tablet; Refill: 5  SUI (stress urinary incontinence, female) Assessment & Plan: - Continue with pelvic floor exercises. Does not want further treatment at this time.    Return 6-8 weeks   [name], MD

## 2023-04-23 DIAGNOSIS — E890 Postprocedural hypothyroidism: Secondary | ICD-10-CM | POA: Diagnosis not present

## 2023-04-23 DIAGNOSIS — C73 Malignant neoplasm of thyroid gland: Secondary | ICD-10-CM | POA: Diagnosis not present

## 2023-04-28 NOTE — Progress Notes (Unsigned)
 April Stone  SUBJECTIVE  History of Present Illness: April Stone is a 63 y.o. female seen in follow-up for mixed incontinence, urge predominant. Plan at last Stone was to start vesicare.  She had a virtual Stone for UTI with PCP on 02/16/23 and was treated with keflex. UTI symptoms have resolved.    She did not take the vesicare because she was concerned about the side effect of cognitive dysfunction due to family history of dementia. Has stopped caffeine and this has really helped her symptoms. If she has a really full bladder, may leak some with urgency, maybe 1-2 times per day. She can exercise without leakage and doing pelvic floor exercise. Leakage with sneezing only happens if she sneezes multiple times and only a small amount. Overall symptoms have improved a lot.   Surgery: s/p Robotic assisted total laparoscopic hysterectomy, bilateral salpingo-oophorectomy, sacrocolpopexy [name], cystoscopy, perineorrhaphy on 12/17/22. SUI not demonstrated on prior Urodynamics.  Past Medical History: Patient  has a past medical history of Anemia, Arthritis, Asthma, Complication of anesthesia, PONV (postoperative nausea and vomiting), and Thyroid cancer (HCC).   Past Surgical History: She  has a past surgical history that includes Appendectomy (1968); Thyroidectomy (2002); Bunionectomy (1996); Tonsillectomy (1967); Xi robotic assisted total hysterectomy with sacrocolpopexy (Bilateral, 12/17/2022); Perineoplasty (N/A, 12/17/2022); and Cystoscopy (N/A, 12/17/2022).   Medications: She has a current medication list which includes the following prescription(s): acetaminophen, albuterol, vitamin d, estradiol, fluticasone, ibuprofen, montelukast, polyethylene glycol powder, synthroid, and trospium.   Allergies: Patient has no known allergies.   Social History: Patient  reports that she has never smoked. She has never used smokeless tobacco. She reports current  alcohol use. She reports that she does not use drugs.     OBJECTIVE     Physical Exam: There were no vitals filed for this Stone.  Gen: No apparent distress, A&O x 3.  Detailed Urogynecologic Evaluation:  Deferred.    ASSESSMENT AND PLAN    April Stone is a 63 y.o. with:  No diagnosis found.   There are no diagnoses linked to this encounter. Return 6-8 weeks   [name], MD

## 2023-04-29 ENCOUNTER — Encounter: Payer: Self-pay | Admitting: Obstetrics and Gynecology

## 2023-04-29 ENCOUNTER — Ambulatory Visit: Payer: Commercial Managed Care - PPO | Admitting: Obstetrics and Gynecology

## 2023-04-29 ENCOUNTER — Other Ambulatory Visit (HOSPITAL_BASED_OUTPATIENT_CLINIC_OR_DEPARTMENT_OTHER): Payer: Self-pay

## 2023-04-29 VITALS — BP 124/86 | HR 76

## 2023-04-29 DIAGNOSIS — N393 Stress incontinence (female) (male): Secondary | ICD-10-CM | POA: Diagnosis not present

## 2023-04-29 DIAGNOSIS — N3281 Overactive bladder: Secondary | ICD-10-CM

## 2023-04-29 NOTE — Assessment & Plan Note (Signed)
-   Will have her undergo urodynamic testing to confirm leakage since this was not demonstrated on prior studies.  - reviewed options of midurethral sling and urethral bulking and handouts provided of each for her to review.

## 2023-04-29 NOTE — Assessment & Plan Note (Signed)
 Continue with trospium 20 mg BID

## 2023-06-03 ENCOUNTER — Ambulatory Visit (INDEPENDENT_AMBULATORY_CARE_PROVIDER_SITE_OTHER): Admitting: Obstetrics and Gynecology

## 2023-06-03 ENCOUNTER — Encounter: Payer: Self-pay | Admitting: Obstetrics and Gynecology

## 2023-06-03 VITALS — BP 129/87 | HR 75

## 2023-06-03 DIAGNOSIS — N393 Stress incontinence (female) (male): Secondary | ICD-10-CM

## 2023-06-03 DIAGNOSIS — R948 Abnormal results of function studies of other organs and systems: Secondary | ICD-10-CM | POA: Diagnosis not present

## 2023-06-03 DIAGNOSIS — N3281 Overactive bladder: Secondary | ICD-10-CM

## 2023-06-03 DIAGNOSIS — E890 Postprocedural hypothyroidism: Secondary | ICD-10-CM | POA: Diagnosis not present

## 2023-06-03 LAB — POCT URINALYSIS DIPSTICK
Bilirubin, UA: NEGATIVE
Blood, UA: NEGATIVE
Glucose, UA: NEGATIVE
Ketones, UA: NEGATIVE
Leukocytes, UA: NEGATIVE
Nitrite, UA: NEGATIVE
Protein, UA: NEGATIVE
Spec Grav, UA: 1.01 (ref 1.010–1.025)
Urobilinogen, UA: 0.2 U/dL
pH, UA: 6 (ref 5.0–8.0)

## 2023-06-03 NOTE — Progress Notes (Signed)
 [facility] Urodynamics Procedure  Referring Physician: Jobe Mulder* Date of Procedure: 06/03/2023  April Stone is a 63 y.o. female who presents for urodynamic evaluation. Indication(s) for study: SUI  Vital Signs: BP 129/87   Pulse 75   LMP 08/01/2013   Laboratory Results: A clean catch urine specimen revealed:  POC urine:  Lab Results  Component Value Date   COLORU Yellow 06/03/2023   CLARITYU Clear 06/03/2023   GLUCOSEUR Negative 06/03/2023   BILIRUBINUR Negative 06/03/2023   KETONESU Negative 06/03/2023   SPECGRAV 1.010 06/03/2023   RBCUR Negative 06/03/2023   PHUR 6.0 06/03/2023   PROTEINUR Negative 06/03/2023   UROBILINOGEN 0.2 06/03/2023   LEUKOCYTESUR Negative 06/03/2023     Voiding Diary: Deferred  Procedure Timeout:  The correct patient was verified and the correct procedure was verified. The patient was in the correct position and safety precautions were reviewed based on at the patient's history.  Urodynamic Procedure A 61F dual lumen urodynamics catheter was placed under sterile conditions into the patient's bladder. A 61F catheter was placed into the rectum in order to measure abdominal pressure. EMG patches were placed in the appropriate position.  All connections were confirmed and calibrations/adjusted made. Saline was instilled into the bladder through the dual lumen catheters.  Cough/valsalva pressures were measured periodically during filling.  Patient was allowed to void.  The bladder was then emptied of its residual.  UROFLOW: Revealed a Qmax of 40.6 mL/sec.  She voided 282 mL and had a residual of 0 mL.  It was a normal pattern and represented normal habits.   CMG: This was performed with sterile water  in the sitting position at a fill rate of 30 mL/min.    First sensation of fullness was 80 mLs,  First urge was 121 mLs,  Strong urge was 185 mLs and  Capacity was 493 mLs  Stress incontinence was  demonstrated Highest positive CLPP was 94 cmH20 at 323 ml. Highest negative VLPP was 68 cmH20at 323 ml.   Detrusor function was normal, with no phasic contractions seen.    Compliance:  Normal. End fill detrusor pressure was 0cmH20.    UPP: MUCP did not pick up   MICTURITION STUDY: Voiding was performed without reduction in the sitting position.  Pdet at Qmax was 60 cm of water .  Qmax was 19 mL/sec.  It was a prolonged normal pattern.  She voided 493 mL and had a residual of 0 mL.  It was a volitional void, sustained detrusor contraction was present and abdominal straining was not present  EMG: This was performed with patches.  She had voluntary contractions, recruitment with fill was present and urethral sphincter was not relaxed with void.  The details of the procedure with the study tracings have been scanned into EPIC.   Urodynamic Impression:  1. Sensation was normal; capacity was normal 2. Stress Incontinence was demonstrated at normal pressures; 3. Detrusor Overactivity was not demonstrated. 4. Emptying was dysfunctional with a normal PVR, a sustained detrusor contraction present,  abdominal straining not present, dyssynergic urethral sphincter activity on EMG.  Plan: - The patient will follow up  to discuss the findings and treatment options.

## 2023-06-24 ENCOUNTER — Telehealth: Admitting: Physician Assistant

## 2023-06-24 ENCOUNTER — Other Ambulatory Visit (HOSPITAL_BASED_OUTPATIENT_CLINIC_OR_DEPARTMENT_OTHER): Payer: Self-pay

## 2023-06-24 DIAGNOSIS — R3989 Other symptoms and signs involving the genitourinary system: Secondary | ICD-10-CM | POA: Diagnosis not present

## 2023-06-24 MED ORDER — CEPHALEXIN 500 MG PO CAPS
500.0000 mg | ORAL_CAPSULE | Freq: Two times a day (BID) | ORAL | 0 refills | Status: AC
  Filled 2023-06-24: qty 14, 7d supply, fill #0

## 2023-06-24 NOTE — Progress Notes (Signed)
 I have spent 5 minutes in review of e-visit questionnaire, review and updating patient chart, medical decision making and response to patient.   Piedad Climes, PA-C

## 2023-06-24 NOTE — Progress Notes (Signed)

## 2023-06-26 ENCOUNTER — Ambulatory Visit: Admitting: Obstetrics and Gynecology

## 2023-07-10 ENCOUNTER — Other Ambulatory Visit (HOSPITAL_BASED_OUTPATIENT_CLINIC_OR_DEPARTMENT_OTHER): Payer: Self-pay

## 2023-07-28 ENCOUNTER — Other Ambulatory Visit: Payer: Self-pay | Admitting: Obstetrics and Gynecology

## 2023-07-28 ENCOUNTER — Other Ambulatory Visit (HOSPITAL_BASED_OUTPATIENT_CLINIC_OR_DEPARTMENT_OTHER): Payer: Self-pay

## 2023-07-28 DIAGNOSIS — N393 Stress incontinence (female) (male): Secondary | ICD-10-CM

## 2023-07-28 MED ORDER — CIPROFLOXACIN HCL 500 MG PO TABS
500.0000 mg | ORAL_TABLET | Freq: Once | ORAL | 0 refills | Status: AC
  Filled 2023-07-28: qty 1, 1d supply, fill #0

## 2023-07-30 ENCOUNTER — Ambulatory Visit: Admitting: Obstetrics and Gynecology

## 2023-07-30 ENCOUNTER — Encounter: Payer: Self-pay | Admitting: Obstetrics and Gynecology

## 2023-07-30 VITALS — BP 147/92 | HR 71

## 2023-07-30 DIAGNOSIS — N393 Stress incontinence (female) (male): Secondary | ICD-10-CM

## 2023-07-30 LAB — POCT URINALYSIS DIP (CLINITEK)
Bilirubin, UA: NEGATIVE
Glucose, UA: NEGATIVE mg/dL
Ketones, POC UA: NEGATIVE mg/dL
Leukocytes, UA: NEGATIVE
Nitrite, UA: NEGATIVE
POC PROTEIN,UA: NEGATIVE
Spec Grav, UA: 1.015 (ref 1.010–1.025)
Urobilinogen, UA: 0.2 U/dL
pH, UA: 6 (ref 5.0–8.0)

## 2023-07-30 MED ORDER — LIDOCAINE HCL URETHRAL/MUCOSAL 2 % EX GEL
1.0000 | Freq: Once | CUTANEOUS | Status: AC
  Administered 2023-07-30: 1 via URETHRAL

## 2023-07-30 MED ORDER — LIDOCAINE-EPINEPHRINE 1 %-1:100000 IJ SOLN
6.0000 mL | Freq: Once | INTRAMUSCULAR | Status: AC
  Administered 2023-07-30: 6 mL

## 2023-07-30 NOTE — Progress Notes (Signed)
 Bulkamid Injection  CC: 63 y.o. y.o. F with stress incontinence who presents for transurethral Bulkamid injection.  Patient signed her consent form.  She started antibiotic prophylaxis today.  Today's Vitals   07/30/23 0900 07/30/23 0901  BP: (!) 149/100 (!) 147/92  Pulse: 78 71    Results for orders placed or performed in visit on 07/30/23 (from the past 24 hours)  POCT URINALYSIS DIP (CLINITEK)     Status: Abnormal   Collection Time: 07/30/23 12:00 PM  Result Value Ref Range   Color, UA yellow yellow   Clarity, UA clear clear   Glucose, UA negative negative mg/dL   Bilirubin, UA negative negative   Ketones, POC UA negative negative mg/dL   Spec Grav, UA 8.984 8.989 - 1.025   Blood, UA trace-intact (A) negative   pH, UA 6.0 5.0 - 8.0   POC PROTEIN,UA negative negative, trace   Urobilinogen, UA 0.2 0.2 or 1.0 E.U./dL   Nitrite, UA Negative Negative   Leukocytes, UA Negative Negative    Procedure: Time out was performed. The bladder was catheterized and 10 ml of 2% lidocaine  jelly placed in the urethra. A urethral block was performed by injecting 3ml of 1% lidocaine  with epinephrine  at 3 and 9 o'clock adjacent to the urethra.  The needle was primed.  The cystoscope was inserted to the level of the bladder neck.  The needle was inserted 2 cm and the scope was pulled back into the urethra 2 cm.  The needle was inserted bevel up at the 5 o'clock position and the Bulkamid was injected to obtain coaptation.  This was repeated at the 2 o'clock,  10 o'clock and 7 o'clock positions.   A total of 2- 1ml syringes were used and good circumferential coaptation was noted.  The patient tolerated the procedure well. She was asked to void after the procedure.  Post-Void Residual (PVR) by Bladder Scan: In order to evaluate bladder emptying, we discussed obtaining a postvoid residual and she agreed to this procedure.  Procedure: The ultrasound unit was placed on the patient's abdomen in the  suprapubic region after the patient had voided.    Post Void Residual - 07/30/23 1000       Post Void Residual   Post Void Residual 30 mL           ASSESSMENT: 63 y.o. y.o. s/p transurethral Bulkamid injection for stress incontinence.   PLAN: Patient will follow up in 4 weeks to reassess. Voiding and post-procedure precautions were given. She will return for heavy bleeding, fevers, dysuria lasting beyond today and incomplete emptying.  All questions were answered.  [name], MD

## 2023-07-30 NOTE — Patient Instructions (Signed)

## 2023-09-02 ENCOUNTER — Encounter: Payer: Self-pay | Admitting: Obstetrics and Gynecology

## 2023-09-02 ENCOUNTER — Ambulatory Visit: Admitting: Obstetrics and Gynecology

## 2023-09-02 VITALS — BP 137/82 | HR 71

## 2023-09-02 DIAGNOSIS — N3281 Overactive bladder: Secondary | ICD-10-CM

## 2023-09-02 DIAGNOSIS — N393 Stress incontinence (female) (male): Secondary | ICD-10-CM

## 2023-09-02 NOTE — Progress Notes (Signed)
  Urogynecology Return Visit  SUBJECTIVE  History of Present Illness: April Stone is a 63 y.o. female seen in follow-up for mixed incontinence. She is currently on trospium  20mg  BID. She underwent urethral bulking 07/30/23.   Since the procedure, she has not had any leakage. She also stopped taking the trospium . Maybe has slight urgency but otherwise feels cured. No dysuria, denies difficulty emptying.   Surgery: s/p Robotic assisted total laparoscopic hysterectomy, bilateral salpingo-oophorectomy, sacrocolpopexy [name], cystoscopy, perineorrhaphy on 12/17/22.   Past Medical History: Patient  has a past medical history of Anemia, Arthritis, Asthma, Complication of anesthesia, PONV (postoperative nausea and vomiting), and Thyroid  cancer (HCC).   Past Surgical History: She  has a past surgical history that includes Appendectomy (1968); Thyroidectomy (2002); Bunionectomy (1996); Tonsillectomy (1967); Xi robotic assisted total hysterectomy with sacrocolpopexy (Bilateral, 12/17/2022); Perineoplasty (N/A, 12/17/2022); and Cystoscopy (N/A, 12/17/2022).   Medications: She has a current medication list which includes the following prescription(s): acetaminophen , albuterol , vitamin d , estradiol , fluticasone , montelukast , polyethylene glycol powder, and synthroid .   Allergies: Patient has no known allergies.   Social History: Patient  reports that she has never smoked. She has never used smokeless tobacco. She reports current alcohol use. She reports that she does not use drugs.     OBJECTIVE     Physical Exam: Vitals:   09/02/23 0957  BP: 137/82  Pulse: 71   Gen: No apparent distress, A&O x 3.  Detailed Urogynecologic Evaluation:  Deferred.    ASSESSMENT AND PLAN    April Stone is a 63 y.o. with:  1. SUI (stress urinary incontinence, female)   2. Overactive bladder     - Symptoms improved after urethral bulking.  - We discussed that the bulking may  lose efficacy over time and may need another injection in the future.   She will follow up 1 year or sooner if needed   [name], MD  Time spent: I spent 15 minutes dedicated to the care of this patient on the date of this encounter to include pre-visit review of records, face-to-face time with the patient and post visit documentation.

## 2023-09-30 ENCOUNTER — Encounter: Payer: Self-pay | Admitting: Family Medicine

## 2023-09-30 ENCOUNTER — Ambulatory Visit: Payer: Self-pay | Admitting: Family Medicine

## 2023-09-30 ENCOUNTER — Other Ambulatory Visit (HOSPITAL_BASED_OUTPATIENT_CLINIC_OR_DEPARTMENT_OTHER): Payer: Self-pay

## 2023-09-30 ENCOUNTER — Ambulatory Visit (INDEPENDENT_AMBULATORY_CARE_PROVIDER_SITE_OTHER): Payer: Commercial Managed Care - PPO | Admitting: Family Medicine

## 2023-09-30 VITALS — BP 130/82 | HR 96 | Temp 98.0°F | Resp 16 | Ht 64.0 in | Wt 189.2 lb

## 2023-09-30 DIAGNOSIS — Z0000 Encounter for general adult medical examination without abnormal findings: Secondary | ICD-10-CM

## 2023-09-30 DIAGNOSIS — J4520 Mild intermittent asthma, uncomplicated: Secondary | ICD-10-CM | POA: Diagnosis not present

## 2023-09-30 DIAGNOSIS — Z23 Encounter for immunization: Secondary | ICD-10-CM | POA: Diagnosis not present

## 2023-09-30 DIAGNOSIS — Z13220 Encounter for screening for lipoid disorders: Secondary | ICD-10-CM

## 2023-09-30 LAB — LIPID PANEL
Cholesterol: 194 mg/dL (ref 0–200)
HDL: 81.7 mg/dL (ref >39.00)
LDL Cholesterol: 102 mg/dL — ABNORMAL HIGH (ref 0–99)
NonHDL: 111.93
Total CHOL/HDL Ratio: 2
Triglycerides: 48 mg/dL (ref 0.0–149.0)
VLDL: 9.6 mg/dL (ref 0.0–40.0)

## 2023-09-30 LAB — COMPREHENSIVE METABOLIC PANEL WITH GFR
ALT: 21 U/L (ref 0–35)
AST: 20 U/L (ref 0–37)
Albumin: 4.1 g/dL (ref 3.5–5.2)
Alkaline Phosphatase: 48 U/L (ref 39–117)
BUN: 26 mg/dL — ABNORMAL HIGH (ref 6–23)
CO2: 29 meq/L (ref 19–32)
Calcium: 8.6 mg/dL (ref 8.4–10.5)
Chloride: 104 meq/L (ref 96–112)
Creatinine, Ser: 0.66 mg/dL (ref 0.40–1.20)
GFR: 93.75 mL/min (ref >60.00)
Glucose, Bld: 96 mg/dL (ref 70–99)
Potassium: 3.9 meq/L (ref 3.5–5.1)
Sodium: 141 meq/L (ref 135–145)
Total Bilirubin: 0.3 mg/dL (ref 0.2–1.2)
Total Protein: 6.6 g/dL (ref 6.0–8.3)

## 2023-09-30 LAB — CBC
HCT: 41.8 % (ref 36.0–46.0)
Hemoglobin: 14 g/dL (ref 12.0–15.0)
MCHC: 33.5 g/dL (ref 30.0–36.0)
MCV: 92.5 fl (ref 78.0–100.0)
Platelets: 143 K/uL — ABNORMAL LOW (ref 150.0–400.0)
RBC: 4.52 Mil/uL (ref 3.87–5.11)
RDW: 14.2 % (ref 11.5–15.5)
WBC: 4.4 K/uL (ref 4.0–10.5)

## 2023-09-30 MED ORDER — ALBUTEROL SULFATE HFA 108 (90 BASE) MCG/ACT IN AERS
2.0000 | INHALATION_SPRAY | RESPIRATORY_TRACT | 2 refills | Status: AC | PRN
  Filled 2023-09-30: qty 6.7, 17d supply, fill #0

## 2023-09-30 NOTE — Addendum Note (Signed)
 Addended by: Travontae Freiberger M on: 09/30/2023 08:05 AM   Modules accepted: Orders

## 2023-09-30 NOTE — Progress Notes (Signed)
 Chief Complaint  Patient presents with   Annual Exam    CPE     Well Woman April Stone is here for a complete physical.   Her last physical was >1 year ago.  Current diet: in general, diet could be better.  Current exercise: walking, kayaking, pilates, strength training. Weight is up a little and she denies fatigue out of ordinary. Patient's last menstrual period was 08/01/2013. Seatbelt? Yes Advanced directive? No- has the form  Health Maintenance Pap/HPV- Yes Mammogram- Yes Colon cancer screening-Yes Shingrix - Yes Tetanus- Yes Hep C screening- Yes HIV screening- Yes  Past Medical History:  Diagnosis Date   Anemia    Arthritis    right knee   Asthma    Follows w/ PCP, Dr. Frann. Rarely has an issue with asthma.   Complication of anesthesia    PONV (postoperative nausea and vomiting)    Thyroid  cancer (HCC)    hypothryroid, Follows w/ endocrinologist, Dr. Tobie.     Past Surgical History:  Procedure Laterality Date   APPENDECTOMY  1968   BUNIONECTOMY  1996   CYSTOSCOPY N/A 12/17/2022   Procedure: CYSTOSCOPY;  Surgeon: Marilynne Rosaline SAILOR, MD;  Location: [facility];  Service: Gynecology;  Laterality: N/A;   PERINEOPLASTY N/A 12/17/2022   Procedure: PERINEOPLASTY;  Surgeon: Marilynne Rosaline SAILOR, MD;  Location: [facility];  Service: Gynecology;  Laterality: N/A;   THYROIDECTOMY  2002   TONSILLECTOMY  1967   XI ROBOTIC ASSISTED TOTAL HYSTERECTOMY WITH SACROCOLPOPEXY Bilateral 12/17/2022   Procedure: XI ROBOTIC ASSISTED TOTAL HYSTERECTOMY WITH BILATERAL SALPINGO-OOPHORECTOMY AND SACROCOLPOPEXY;  Surgeon: Marilynne Rosaline SAILOR, MD;  Location: [facility];  Service: Gynecology;  Laterality: Bilateral;  Total time requested is 3.5 hrs.    Medications  Current Outpatient Medications on File Prior to Visit  Medication Sig Dispense Refill   acetaminophen  (TYLENOL ) 500 MG tablet Take 1 tablet (500 mg total) by mouth  every 6 (six) hours as needed (pain). 100 tablet 0   Cholecalciferol (VITAMIN D ) 1000 UNITS capsule Take 2,000 Units by mouth daily.     estradiol  (ESTRACE ) 0.1 MG/GM vaginal cream Place 0.5 grams vaginally every night for two weeks, then twice a week after. 42.5 g 11   fluticasone  (FLONASE ) 50 MCG/ACT nasal spray Place 1-2 sprays into both nostrils 2 (two) times daily. (Patient taking differently: Place 1-2 sprays into both nostrils as needed.) 16 g 10   montelukast  (SINGULAIR ) 10 MG tablet Take 1 tablet (10 mg total) by mouth at bedtime. (Patient taking differently: Take 10 mg by mouth at bedtime as needed.) 30 tablet 3   SYNTHROID  125 MCG tablet Take 1 tablet (125 mcg total) by mouth daily. 90 tablet 3    Allergies No Known Allergies  Review of Systems: Constitutional:  no unexpected weight changes Eye:  no recent significant change in vision Ear/Nose/Mouth/Throat:  Ears:  no recent change in hearing Nose/Mouth/Throat:  no complaints of nasal congestion, no sore throat Cardiovascular: no chest pain Respiratory:  no shortness of breath Gastrointestinal:  no abdominal pain, no change in bowel habits GU:  Female: negative for dysuria or pelvic pain Musculoskeletal/Extremities:  no pain of the joints Integumentary (Skin/Breast):  no abnormal skin lesions reported Neurologic:  no headaches Endocrine:  denies fatigue  Exam BP 130/82 (BP Location: Left Arm, Patient Position: Sitting)   Pulse 96   Temp 98 F (36.7 C) (Oral)   Resp 16   Ht 5' 4 (1.626 m)   Wt  189 lb 3.2 oz (85.8 kg)   LMP 08/01/2013   SpO2 97%   BMI 32.48 kg/m  General:  well developed, well nourished, in no apparent distress Skin:  no significant moles, warts, or growths Head:  no masses, lesions, or tenderness Eyes:  pupils equal and round, sclera anicteric without injection Ears:  canals without lesions, TMs shiny without retraction, no obvious effusion, no erythema Nose:  nares patent, mucosa normal, and no  drainage  Throat/Pharynx:  lips and gingiva without lesion; tongue and uvula midline; non-inflamed pharynx; no exudates or postnasal drainage Neck: neck supple without adenopathy, thyromegaly, or masses Lungs:  clear to auscultation, breath sounds equal bilaterally, no respiratory distress Cardio:  regular rate and rhythm, no LE edema Abdomen:  abdomen soft, nontender; bowel sounds normal; no masses or organomegaly Genital: Defer to GYN Musculoskeletal:  symmetrical muscle groups noted without atrophy or deformity Extremities:  no clubbing, cyanosis, or edema, no deformities, no skin discoloration Neuro:  gait normal; deep tendon reflexes normal and symmetric Psych: well oriented with normal range of affect and appropriate judgment/insight  Assessment and Plan  Well adult exam - Plan: CBC, Comprehensive metabolic panel with GFR, Lipid panel  Mild intermittent asthma without complication - Plan: albuterol  (VENTOLIN  HFA) 108 (90 Base) MCG/ACT inhaler   Well 63 y.o. female. Counseled on diet and exercise. Advanced directive form provided today.  PCV20 today.  Other orders as above. Follow up in 1 yr. The patient voiced understanding and agreement to the plan.  Mabel [place] Ogles[place], DO 09/30/23 7:07 AM

## 2023-09-30 NOTE — Patient Instructions (Addendum)
 Give us  2-3 business days to get the results of your labs back.   Keep the diet clean and stay active.  Please get me a copy of your advanced directive form at your convenience.   I recommend getting the flu shot in mid October. This suggestion would change if the CDC comes out with a different recommendation.   Let us  know if you need anything.

## 2024-02-08 ENCOUNTER — Encounter: Payer: Self-pay | Admitting: *Deleted

## 2024-10-05 ENCOUNTER — Encounter: Admitting: Family Medicine
